# Patient Record
Sex: Male | Born: 1995 | Race: Black or African American | Hispanic: No | Marital: Single | Smoking: Never smoker
Health system: Southern US, Community
[De-identification: ages and names within clinical notes are randomized; demographics above are authoritative.]

## PROBLEM LIST (undated history)

## (undated) HISTORY — PX: MENISCUS REPAIR: SHX5179

---

## 2020-06-21 ENCOUNTER — Emergency Department (HOSPITAL_COMMUNITY)
Admission: EM | Admit: 2020-06-21 | Discharge: 2020-06-21 | Disposition: A | Discharge status: Home / Self Care | Payer: 59 | Attending: Emergency Medicine | Admitting: Emergency Medicine

## 2020-06-21 ENCOUNTER — Encounter (HOSPITAL_COMMUNITY): Payer: Self-pay

## 2020-06-21 ENCOUNTER — Other Ambulatory Visit: Payer: Self-pay

## 2020-06-21 ENCOUNTER — Emergency Department (HOSPITAL_COMMUNITY): Payer: 59

## 2020-06-21 DIAGNOSIS — W108XXA Fall (on) (from) other stairs and steps, initial encounter: Secondary | ICD-10-CM | POA: Insufficient documentation

## 2020-06-21 DIAGNOSIS — H9311 Tinnitus, right ear: Secondary | ICD-10-CM | POA: Insufficient documentation

## 2020-06-21 DIAGNOSIS — Z79899 Other long term (current) drug therapy: Secondary | ICD-10-CM | POA: Insufficient documentation

## 2020-06-21 DIAGNOSIS — S0181XA Laceration without foreign body of other part of head, initial encounter: Secondary | ICD-10-CM | POA: Diagnosis not present

## 2020-06-21 DIAGNOSIS — S0993XA Unspecified injury of face, initial encounter: Secondary | ICD-10-CM | POA: Diagnosis present

## 2020-06-21 DIAGNOSIS — R42 Dizziness and giddiness: Secondary | ICD-10-CM | POA: Insufficient documentation

## 2020-06-21 DIAGNOSIS — Z20822 Contact with and (suspected) exposure to covid-19: Secondary | ICD-10-CM | POA: Insufficient documentation

## 2020-06-21 DIAGNOSIS — S02609A Fracture of mandible, unspecified, initial encounter for closed fracture: Secondary | ICD-10-CM | POA: Diagnosis not present

## 2020-06-21 DIAGNOSIS — Y999 Unspecified external cause status: Secondary | ICD-10-CM | POA: Insufficient documentation

## 2020-06-21 DIAGNOSIS — Y939 Activity, unspecified: Secondary | ICD-10-CM | POA: Insufficient documentation

## 2020-06-21 DIAGNOSIS — Y929 Unspecified place or not applicable: Secondary | ICD-10-CM | POA: Insufficient documentation

## 2020-06-21 LAB — CBC WITH DIFFERENTIAL/PLATELET
Abs Immature Granulocytes: 0.06 10*3/uL (ref 0.00–0.07)
Basophils Absolute: 0.1 10*3/uL (ref 0.0–0.1)
Basophils Relative: 1 %
Eosinophils Absolute: 0.1 10*3/uL (ref 0.0–0.5)
Eosinophils Relative: 0 %
HCT: 44.6 % (ref 39.0–52.0)
Hemoglobin: 14.3 g/dL (ref 13.0–17.0)
Immature Granulocytes: 0 %
Lymphocytes Relative: 9 %
Lymphs Abs: 1.5 10*3/uL (ref 0.7–4.0)
MCH: 27.3 pg (ref 26.0–34.0)
MCHC: 32.1 g/dL (ref 30.0–36.0)
MCV: 85.3 fL (ref 80.0–100.0)
Monocytes Absolute: 1 10*3/uL (ref 0.1–1.0)
Monocytes Relative: 6 %
Neutro Abs: 14.1 10*3/uL — ABNORMAL HIGH (ref 1.7–7.7)
Neutrophils Relative %: 84 %
Platelets: 218 10*3/uL (ref 150–400)
RBC: 5.23 MIL/uL (ref 4.22–5.81)
RDW: 14.5 % (ref 11.5–15.5)
WBC: 16.9 10*3/uL — ABNORMAL HIGH (ref 4.0–10.5)
nRBC: 0 % (ref 0.0–0.2)

## 2020-06-21 LAB — COMPREHENSIVE METABOLIC PANEL
ALT: 20 U/L (ref 0–44)
AST: 22 U/L (ref 15–41)
Albumin: 5 g/dL (ref 3.5–5.0)
Alkaline Phosphatase: 40 U/L (ref 38–126)
Anion gap: 11 (ref 5–15)
BUN: 20 mg/dL (ref 6–20)
CO2: 28 mmol/L (ref 22–32)
Calcium: 9.8 mg/dL (ref 8.9–10.3)
Chloride: 100 mmol/L (ref 98–111)
Creatinine, Ser: 1.38 mg/dL — ABNORMAL HIGH (ref 0.61–1.24)
GFR calc Af Amer: >60 mL/min (ref >60)
GFR calc non Af Amer: >60 mL/min (ref >60)
Glucose, Bld: 155 mg/dL — ABNORMAL HIGH (ref 70–99)
Potassium: 4.3 mmol/L (ref 3.5–5.1)
Sodium: 139 mmol/L (ref 135–145)
Total Bilirubin: 0.5 mg/dL (ref 0.3–1.2)
Total Protein: 7.8 g/dL (ref 6.5–8.1)

## 2020-06-21 MED ORDER — BACITRACIN ZINC 500 UNIT/GM EX OINT
TOPICAL_OINTMENT | Freq: Two times a day (BID) | CUTANEOUS | Status: DC
  Administered 2020-06-21: 1 via TOPICAL
  Filled 2020-06-21: qty 0.9

## 2020-06-21 MED ORDER — LIDOCAINE-EPINEPHRINE 2 %-1:100000 IJ SOLN
20.0000 mL | Freq: Once | INTRAMUSCULAR | Status: AC
  Administered 2020-06-21: 20 mL
  Filled 2020-06-21: qty 1

## 2020-06-21 MED ORDER — SODIUM CHLORIDE 0.9 % IV BOLUS
1000.0000 mL | Freq: Once | INTRAVENOUS | Status: AC
  Administered 2020-06-21: 1000 mL via INTRAVENOUS

## 2020-06-21 MED ORDER — HYDROCODONE-ACETAMINOPHEN 5-325 MG PO TABS: 1.0000 | ORAL_TABLET | Freq: Four times a day (QID) | ORAL | 0 refills | Status: DC | PRN

## 2020-06-21 NOTE — ED Provider Notes (Signed)
[facility]-EMERGENCY DEPT Provider Note   CSN: [id number] Arrival date & time: 06/21/20  1818     History Chief Complaint  Patient presents with  . Laceration    Voris Tigert is a 24 y.o. male.  The history is provided by the patient and medical records. No language interpreter was used.  Laceration  Freedom Peddy is a 24 y.o. male who presents to the Emergency Department complaining of laceration. He presents the emergency department for evaluation following falling down five stairs. He states that he has been suffering from allergies over the last few days. Today he became dizzy and fell down five stairs. He struck his chin. He complains of persistent dizziness as well as pain to his right jaw and ringing in his right ear. He has no known medical problems and takes no medications. He denies any fevers, chest pain, shortness of breath, nausea, vomiting, diarrhea. Last tetanus is unknown. He declines tetanus vaccination in the emergency department.    History reviewed. No pertinent past medical history.  There are no problems to display for this patient.   History reviewed. No pertinent surgical history.     No family history on file.  Social History   Tobacco Use  . Smoking status: Not on file  Substance Use Topics  . Alcohol use: Not on file  . Drug use: Not on file    Home Medications Prior to Admission medications   Medication Sig Start Date End Date Taking? Authorizing Provider  HYDROcodone-acetaminophen (NORCO/VICODIN) 5-325 MG tablet Take 1 tablet by mouth every 6 (six) hours as needed. 06/21/20   Tilden Fossa, MD    Allergies    Patient has no known allergies.  Review of Systems   Review of Systems  All other systems reviewed and are negative.   Physical Exam Updated Vital Signs BP (!) 112/49 (BP Location: Left Arm)   Pulse 75   Temp 98.8 F (37.1 C) (Oral)   Resp 14   Ht 6\' 3"  (1.905 m)   Wt 97.5 kg   SpO2 100%   BMI  26.87 kg/m   Physical Exam Vitals and nursing note reviewed.  Constitutional:      Appearance: He is well-developed.  HENT:     Head: Normocephalic.     Comments: Deep laceration to the chin. Able to fully open and close the mouth. There is mild tenderness to palpation over the right TMJ. No hemotympanum. Cardiovascular:     Rate and Rhythm: Normal rate and regular rhythm.     Heart sounds: No murmur heard.   Pulmonary:     Effort: Pulmonary effort is normal. No respiratory distress.     Breath sounds: Normal breath sounds.  Abdominal:     Palpations: Abdomen is soft.     Tenderness: There is no abdominal tenderness. There is no guarding or rebound.  Musculoskeletal:        General: No tenderness.     Cervical back: Neck supple.  Skin:    General: Skin is warm and dry.  Neurological:     Mental Status: He is alert and oriented to person, place, and time.     Comments: 5/5 strength in all four extremities  Psychiatric:        Behavior: Behavior normal.     ED Results / Procedures / Treatments   Labs (all labs ordered are listed, but only abnormal results are displayed) Labs Reviewed  COMPREHENSIVE METABOLIC PANEL - Abnormal; Notable for the following  components:      Result Value   Glucose, Bld 155 (*)    Creatinine, Ser 1.38 (*)    All other components within normal limits  CBC WITH DIFFERENTIAL/PLATELET - Abnormal; Notable for the following components:   WBC 16.9 (*)    Neutro Abs 14.1 (*)    All other components within normal limits  SARS CORONAVIRUS 2 BY RT PCR (HOSPITAL ORDER, PERFORMED IN [facility])    EKG None  Radiology CT Head Wo Contrast  Result Date: 06/21/2020 CLINICAL DATA:  Facial trauma, fall with chin laceration EXAM: CT HEAD WITHOUT CONTRAST CT MAXILLOFACIAL WITHOUT CONTRAST TECHNIQUE: Multidetector CT imaging of the head and maxillofacial structures were performed using the standard protocol without intravenous contrast.  Multiplanar CT image reconstructions of the maxillofacial structures were also generated. COMPARISON:  None. FINDINGS: CT HEAD FINDINGS Brain: No acute territorial infarction, hemorrhage or intracranial mass. The ventricles are nonenlarged. Vascular: No hyperdense vessel or unexpected calcification. Skull: Normal. Negative for fracture or focal lesion. Other: None CT MAXILLOFACIAL FINDINGS Osseous: Mandibular heads are normally position. The mastoid air cells are clear. Acute mildly comminuted, nondisplaced fractures involving the bilateral mandibular heads and necks. Pterygoid plates and zygomatic arches are intact. Minimal anterior nasal bone deformity. Orbits: Negative. No traumatic or inflammatory finding. Sinuses: Clear. Soft tissues: Deep laceration at the chin, approaches the bony mental protuberance. IMPRESSION: 1. No CT evidence for acute intracranial abnormality. 2. Acute mildly comminuted, nondisplaced bilateral mandibular head and neck fractures. No mandibular head dislocation. 3. Deep laceration at the chin. Electronically Signed   By: Jasmine Pang M.D.   On: 06/21/2020 21:57   CT Maxillofacial WO CM  Result Date: 06/21/2020 CLINICAL DATA:  Facial trauma, fall with chin laceration EXAM: CT HEAD WITHOUT CONTRAST CT MAXILLOFACIAL WITHOUT CONTRAST TECHNIQUE: Multidetector CT imaging of the head and maxillofacial structures were performed using the standard protocol without intravenous contrast. Multiplanar CT image reconstructions of the maxillofacial structures were also generated. COMPARISON:  None. FINDINGS: CT HEAD FINDINGS Brain: No acute territorial infarction, hemorrhage or intracranial mass. The ventricles are nonenlarged. Vascular: No hyperdense vessel or unexpected calcification. Skull: Normal. Negative for fracture or focal lesion. Other: None CT MAXILLOFACIAL FINDINGS Osseous: Mandibular heads are normally position. The mastoid air cells are clear. Acute mildly comminuted, nondisplaced  fractures involving the bilateral mandibular heads and necks. Pterygoid plates and zygomatic arches are intact. Minimal anterior nasal bone deformity. Orbits: Negative. No traumatic or inflammatory finding. Sinuses: Clear. Soft tissues: Deep laceration at the chin, approaches the bony mental protuberance. IMPRESSION: 1. No CT evidence for acute intracranial abnormality. 2. Acute mildly comminuted, nondisplaced bilateral mandibular head and neck fractures. No mandibular head dislocation. 3. Deep laceration at the chin. Electronically Signed   By: Jasmine Pang M.D.   On: 06/21/2020 21:57    Procedures .Marland KitchenLaceration Repair  Date/Time: 06/21/2020 11:12 PM Performed by: Tilden Fossa, MD Authorized by: Tilden Fossa, MD   Consent:    Consent obtained:  Verbal   Consent given by:  Patient   Risks discussed:  Infection, pain, poor cosmetic result and need for additional repair Anesthesia (see MAR for exact dosages):    Anesthesia method:  Local infiltration   Local anesthetic:  Lidocaine 2% WITH epi Laceration details:    Location:  Face   Face location:  Chin   Length (cm):  1 Repair type:    Repair type:  Complex Exploration:    Wound exploration: wound explored through full range of motion  Wound extent: foreign bodies/material     Foreign bodies/material:  Hair   Contaminated: yes   Treatment:    Area cleansed with:  Shur-Clens and saline   Irrigation method:  Syringe   Visualized foreign bodies/material removed: yes     Debridement:  Moderate Subcutaneous repair:    Suture size:  5-0   Suture material:  Vicryl   Suture technique:  Simple interrupted   Number of sutures:  2 Skin repair:    Repair method:  Sutures   Suture size:  4-0   Suture material:  Prolene   Suture technique:  Simple interrupted   Number of sutures:  5 Approximation:    Approximation:  Close Post-procedure details:    Dressing:  Antibiotic ointment and non-adherent dressing   Patient tolerance  of procedure:  Tolerated well, no immediate complications   (including critical care time)  Medications Ordered in ED Medications  bacitracin ointment (has no administration in time range)  sodium chloride 0.9 % bolus 1,000 mL (0 mLs Intravenous Stopped 06/21/20 2219)  lidocaine-EPINEPHrine (XYLOCAINE W/EPI) 2 %-1:100000 (with pres) injection 20 mL (20 mLs Infiltration Given by Other 06/21/20 2217)    ED Course  I have reviewed the triage vital signs and the nursing notes.  Pertinent labs & imaging results that were available during my care of the patient were reviewed by me and considered in my medical decision making (see chart for details).    MDM Rules/Calculators/A&P                         Patient here for evaluation of injury after having a dizzy spell and falling down five stairs. He is a deep laceration to his chin that was repaired per procedure note. There is a large amount of hair removed from the wound. Imaging is significant for by mandibular head and neck fractures. Discussed findings of imaging with Dr. Kelly Splinter Dillingham with plastic surgery who recommends liquid diet and follow-up in the office. There is no evidence of open fracture on examination. Discussed with patient home care for mandible fracture as well as chin laceration.  Final Clinical Impression(s) / ED Diagnoses Final diagnoses:  Chin laceration, initial encounter  Closed fracture of mandible, unspecified laterality, unspecified mandibular site, initial encounter [facility])    Rx / DC Orders ED Discharge Orders         Ordered    HYDROcodone-acetaminophen (NORCO/VICODIN) 5-325 MG tablet  Every 6 hours PRN        06/21/20 2305           Tilden Fossa, MD 06/21/20 2316

## 2020-06-21 NOTE — Discharge Instructions (Signed)
You have a broken jaw (mandible) and should not eat solid food. Only have a liquid diet until you follow-up with the surgeon. Wash your laceration every day. Get rechecked immediately if you develop drainage, fevers, increased pain or new concerning symptoms.

## 2020-06-21 NOTE — ED Triage Notes (Signed)
Patient arrived stating he fell on concrete at 5pm today causing a chin laceration. Bleeding controlled. Denies LOC

## 2020-06-21 NOTE — ED Notes (Signed)
Pt transported to CT ?

## 2020-06-22 LAB — SARS CORONAVIRUS 2 BY RT PCR (HOSPITAL ORDER, PERFORMED IN ~~LOC~~ HOSPITAL LAB): SARS Coronavirus 2: NEGATIVE

## 2021-03-25 IMAGING — CT CT HEAD W/O CM
3 series · 15 of 47 positions shown, 18 images · non-contrast
Comparison: None.

CLINICAL DATA: Facial trauma, fall with chin laceration

EXAM:
CT HEAD WITHOUT CONTRAST
CT MAXILLOFACIAL WITHOUT CONTRAST
TECHNIQUE: Multidetector CT imaging of the head and maxillofacial structures
were performed using the standard protocol without intravenous
contrast. Multiplanar CT image reconstructions of the maxillofacial
structures were also generated.

[Series 3: head wo · axial · 0.45mm/px · z∈[-162,-27]mm · 9 of 33 slices shown, 12 images]
[im 3/33  brain]
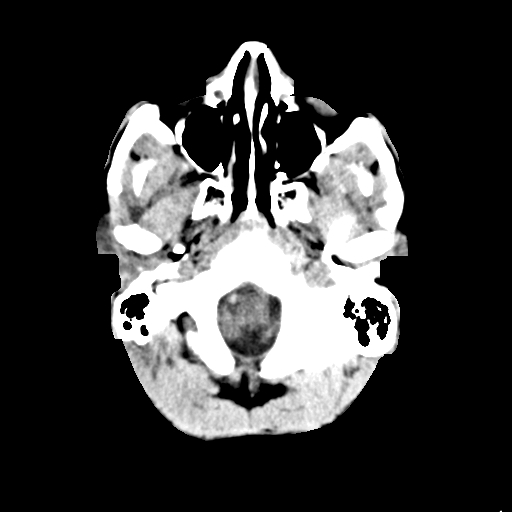
[im 3/33  bone]
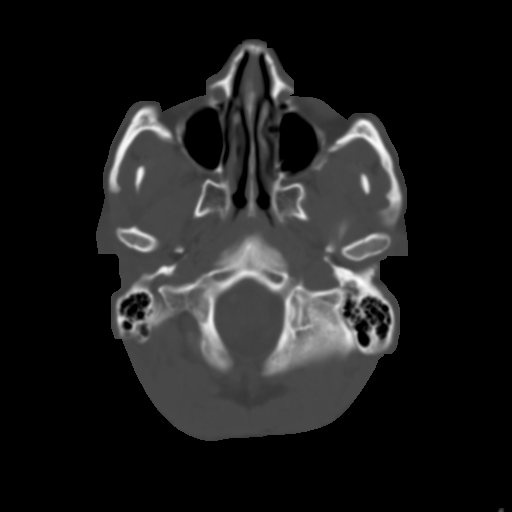
[im 6/33  brain]
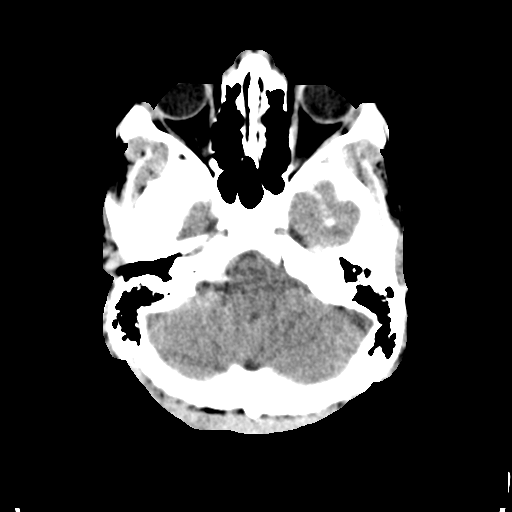
[im 9/33  brain]
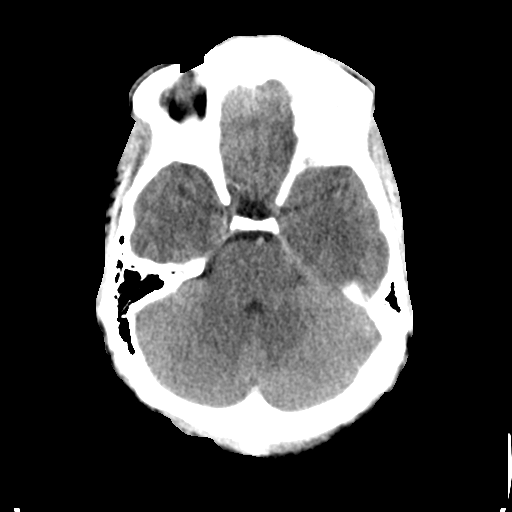
[im 13/33  brain]
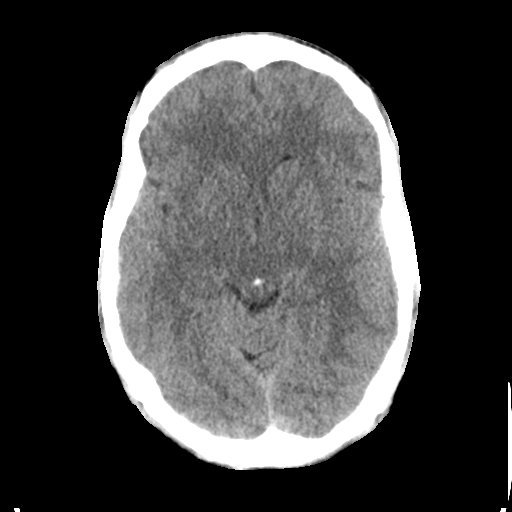
[im 17/33  brain]
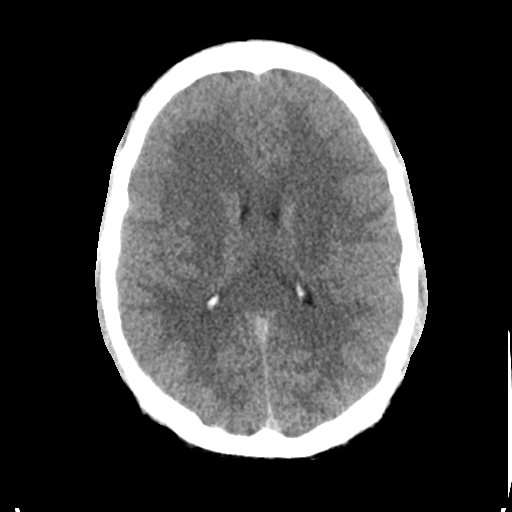
[im 17/33  bone]
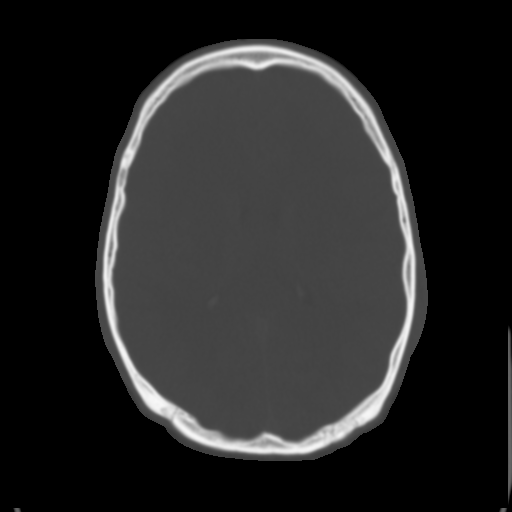
[im 20/33  brain]
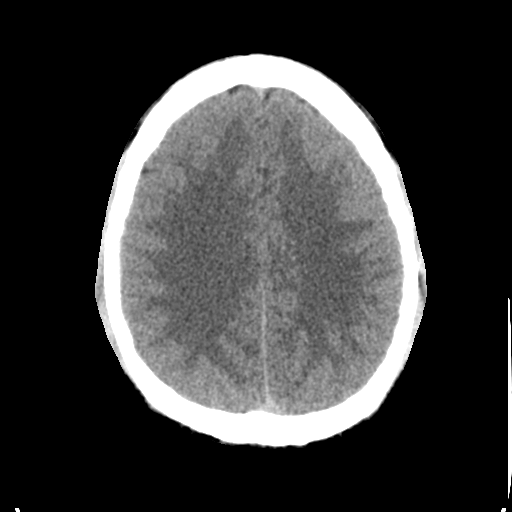
[im 24/33  brain]
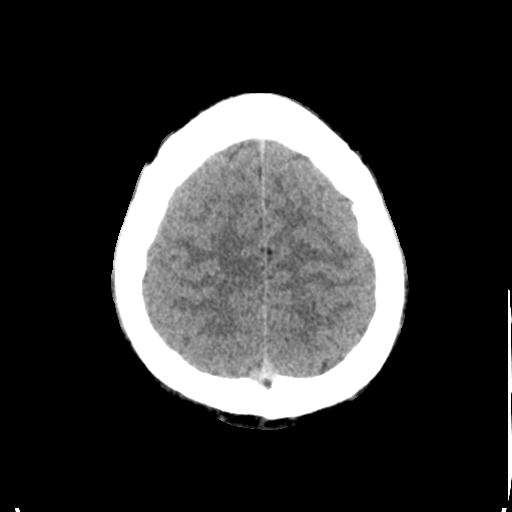
[im 27/33  brain]
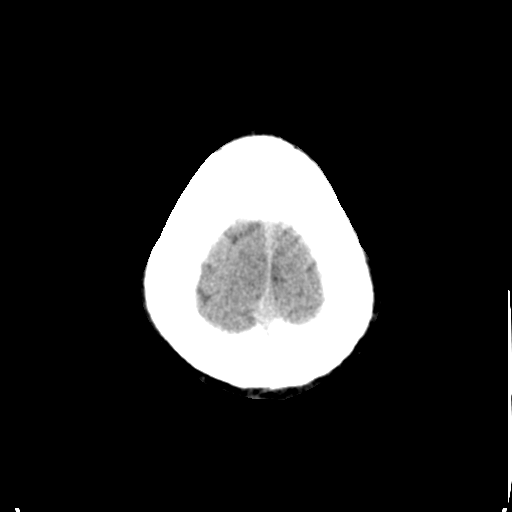
[im 30/33  brain]
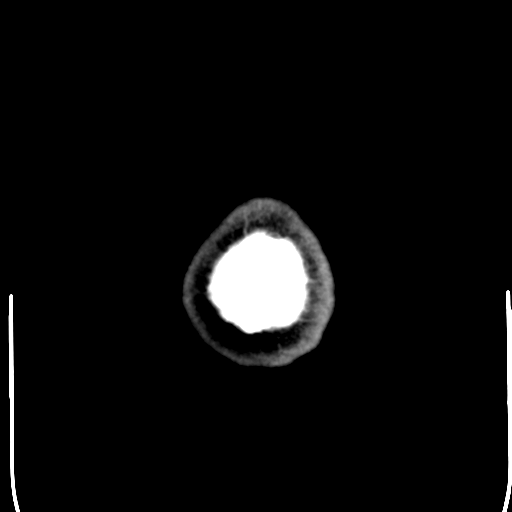
[im 30/33  bone]
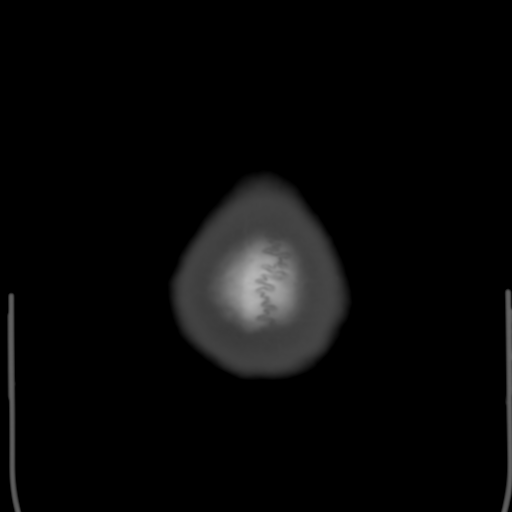

[Series 10: coronal soft tissue · coronal · 0.33mm/px · 3 of 75 slices shown]
[im 25/75  brain]
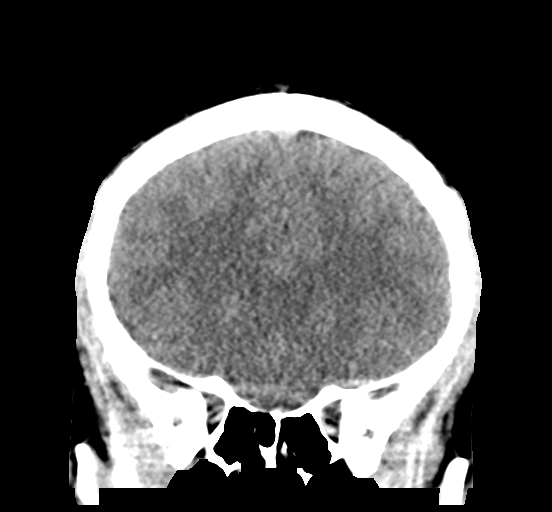
[im 33/75  brain]
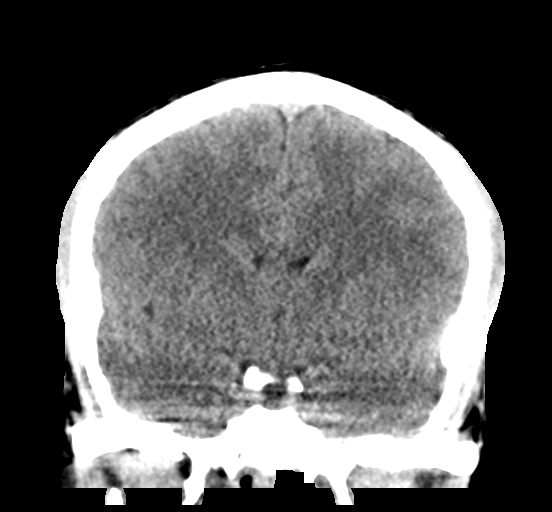
[im 42/75  brain]
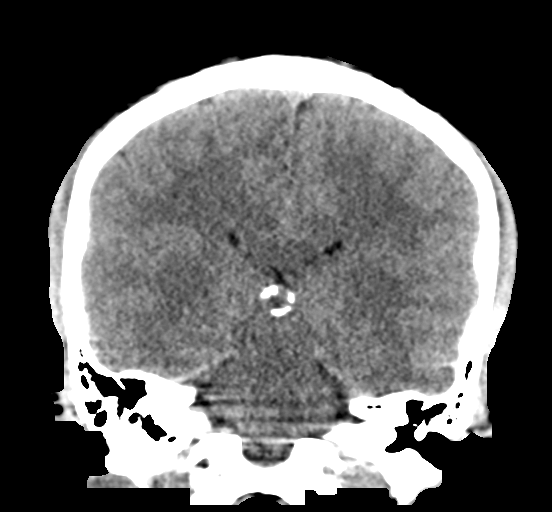

[Series 11: sagittal soft tissue · sagittal · 0.34mm/px · 3 of 63 slices shown]
[im 21/63  brain]
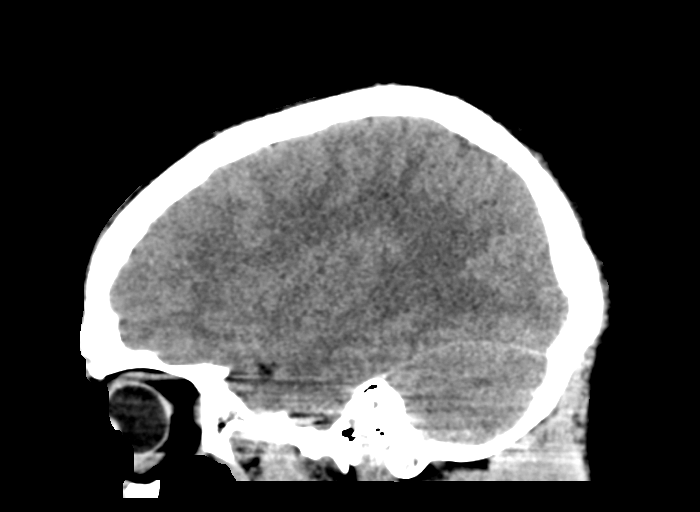
[im 32/63  brain]
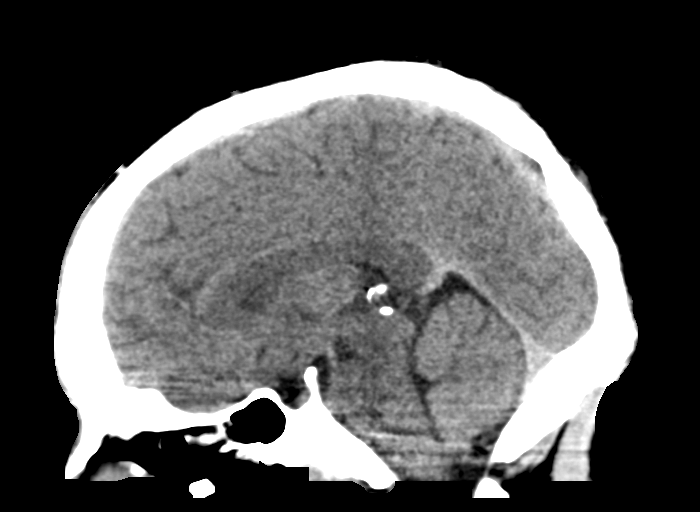
[im 42/63  brain]
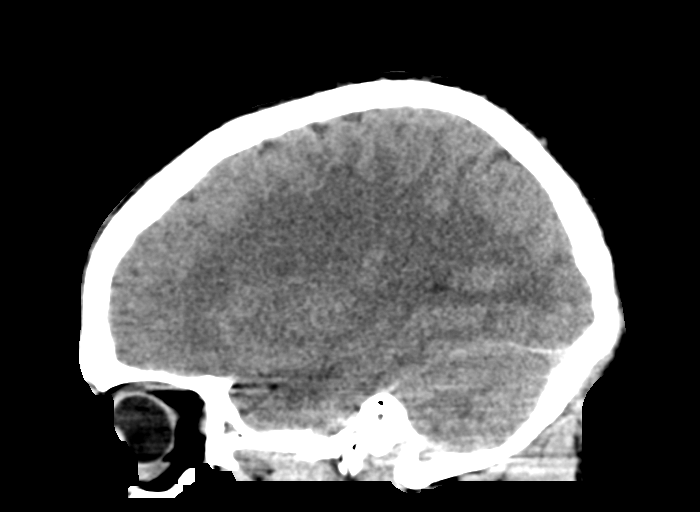

[15 of 47 positions shown; findings below may reference images not displayed]

FINDINGS: CT HEAD FINDINGS

Brain: No acute territorial infarction, hemorrhage or intracranial
mass. The ventricles are nonenlarged.

Vascular: No hyperdense vessel or unexpected calcification.

Skull: Normal. Negative for fracture or focal lesion.

Other: None

CT MAXILLOFACIAL FINDINGS

Osseous: Mandibular heads are normally position. The mastoid air
cells are clear. Acute mildly comminuted, nondisplaced fractures
involving the bilateral mandibular heads and necks. Pterygoid plates
and zygomatic arches are intact. Minimal anterior nasal bone
deformity.

Orbits: Negative. No traumatic or inflammatory finding.

Sinuses: Clear.

Soft tissues: Deep laceration at the chin, approaches the bony
mental protuberance.
IMPRESSION: 1. No CT evidence for acute intracranial abnormality.
2. Acute mildly comminuted, nondisplaced bilateral mandibular head
and neck fractures. No mandibular head dislocation.
3. Deep laceration at the chin.

## 2021-03-25 IMAGING — CT CT MAXILLOFACIAL W/O CM
3 series · 15 of 47 positions shown, 18 images · non-contrast
Comparison: None.

CLINICAL DATA: Facial trauma, fall with chin laceration

EXAM:
CT HEAD WITHOUT CONTRAST
CT MAXILLOFACIAL WITHOUT CONTRAST
TECHNIQUE: Multidetector CT imaging of the head and maxillofacial structures
were performed using the standard protocol without intravenous
contrast. Multiplanar CT image reconstructions of the maxillofacial
structures were also generated.

[Series 6: max soft · axial · 0.35mm/px · z∈[-271,-121]mm · 9 of 89 slices shown, 12 images]
[im 7/89  brain]
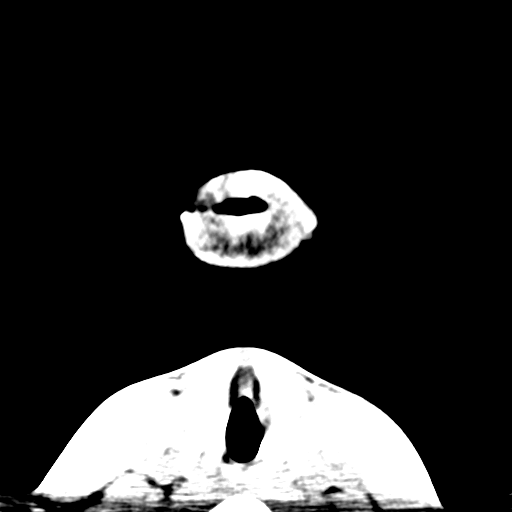
[im 7/89  bone]
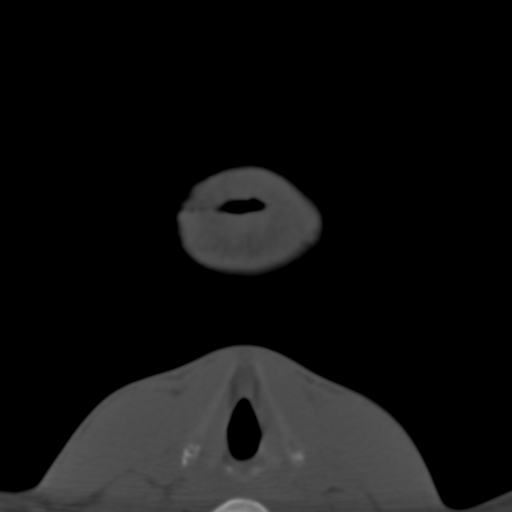
[im 16/89  bone]
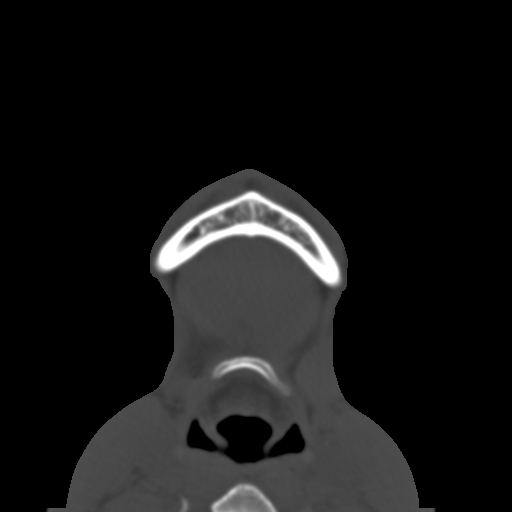
[im 25/89  bone]
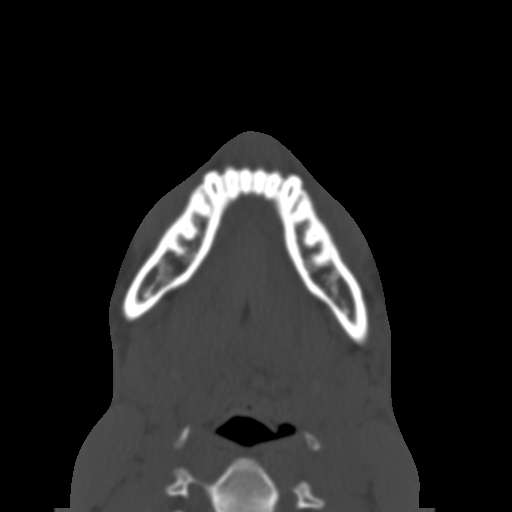
[im 34/89  bone]
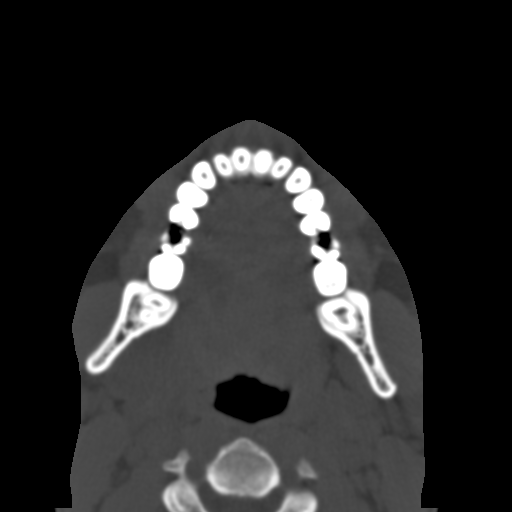
[im 46/89  brain]
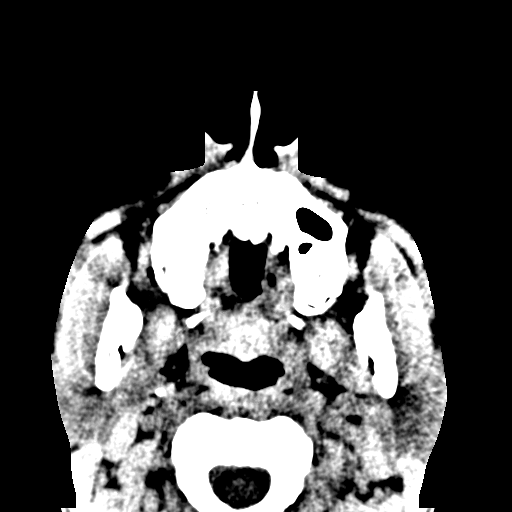
[im 46/89  bone]
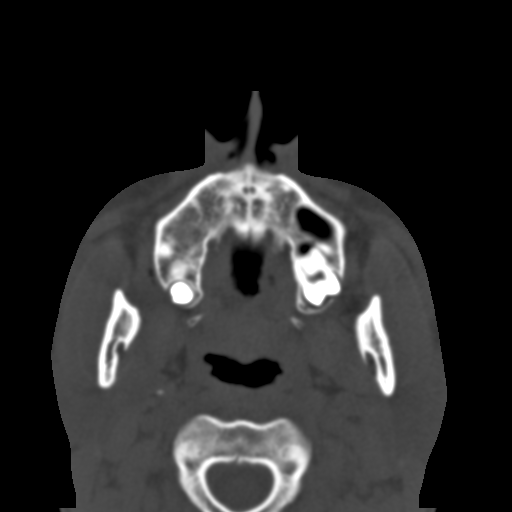
[im 55/89  bone]
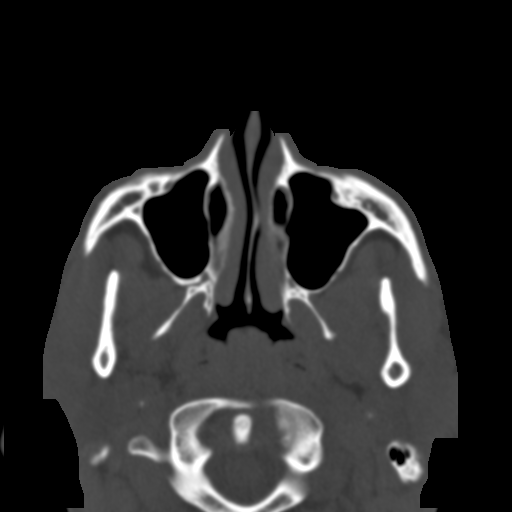
[im 64/89  bone]
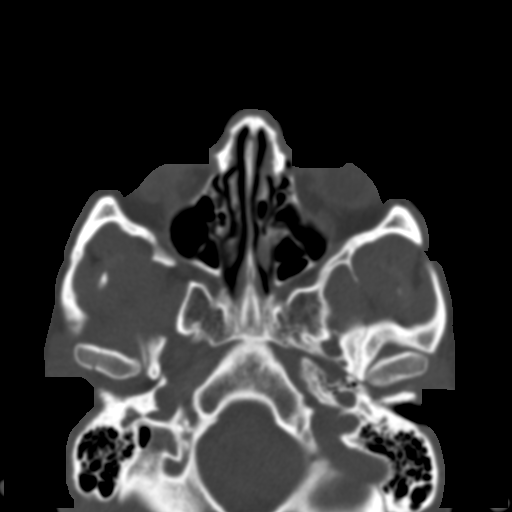
[im 73/89  bone]
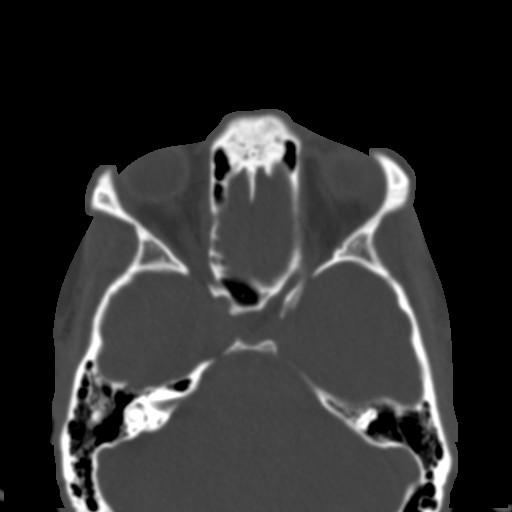
[im 82/89  brain]
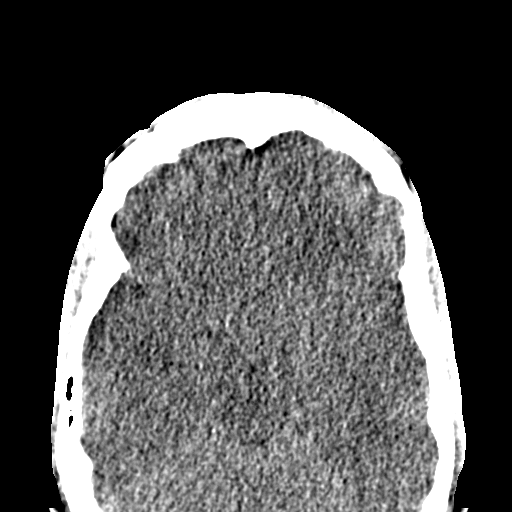
[im 82/89  bone]
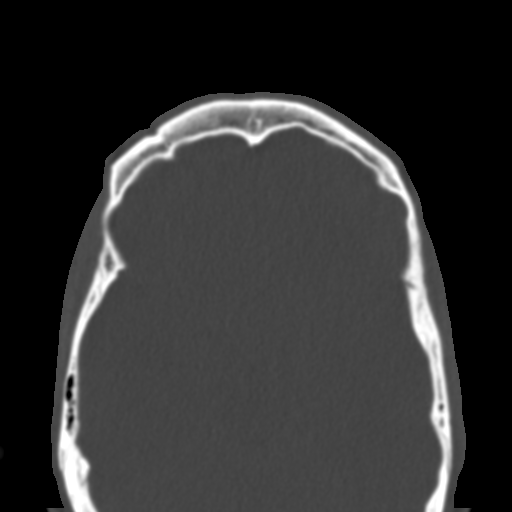

[Series 12: coronal soft · coronal · 0.36mm/px · 3 of 82 slices shown]
[im 28/82  bone]
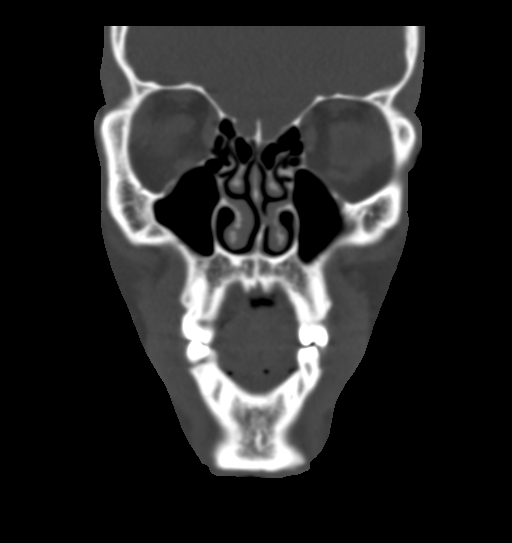
[im 37/82  bone]
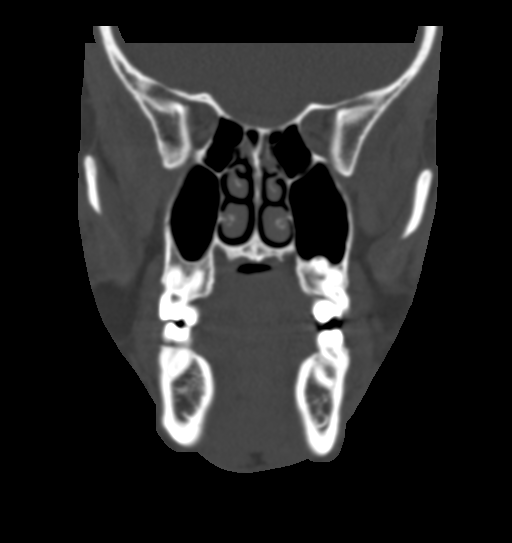
[im 46/82  bone]
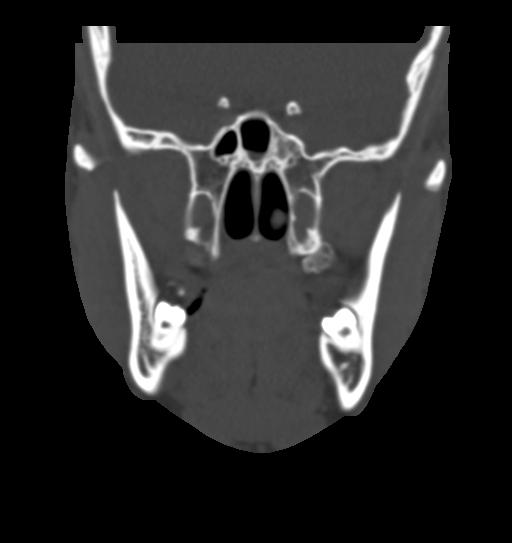

[Series 13: sagittal soft · sagittal · 0.31mm/px · 3 of 92 slices shown]
[im 31/92  bone]
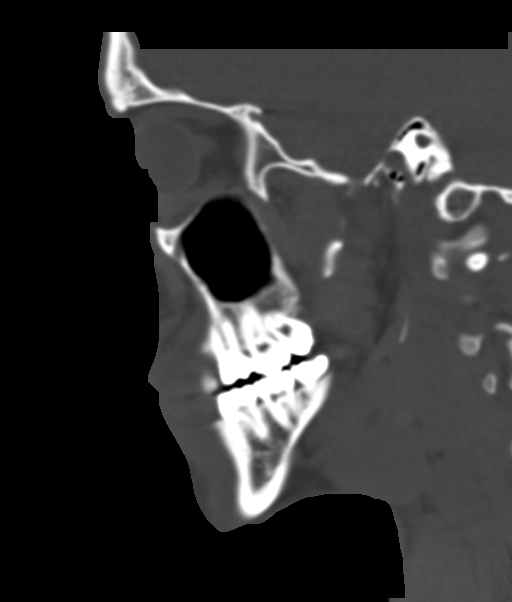
[im 46/92  bone]
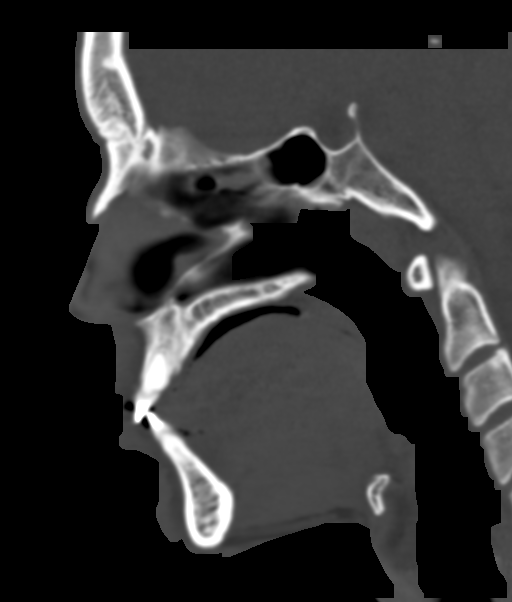
[im 61/92  bone]
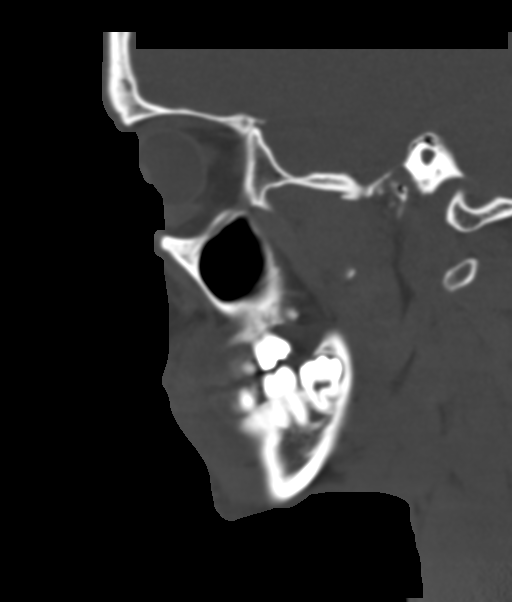

[15 of 47 positions shown; findings below may reference images not displayed]

FINDINGS: CT HEAD FINDINGS

Brain: No acute territorial infarction, hemorrhage or intracranial
mass. The ventricles are nonenlarged.

Vascular: No hyperdense vessel or unexpected calcification.

Skull: Normal. Negative for fracture or focal lesion.

Other: None

CT MAXILLOFACIAL FINDINGS

Osseous: Mandibular heads are normally position. The mastoid air
cells are clear. Acute mildly comminuted, nondisplaced fractures
involving the bilateral mandibular heads and necks. Pterygoid plates
and zygomatic arches are intact. Minimal anterior nasal bone
deformity.

Orbits: Negative. No traumatic or inflammatory finding.

Sinuses: Clear.

Soft tissues: Deep laceration at the chin, approaches the bony
mental protuberance.
IMPRESSION: 1. No CT evidence for acute intracranial abnormality.
2. Acute mildly comminuted, nondisplaced bilateral mandibular head
and neck fractures. No mandibular head dislocation.
3. Deep laceration at the chin.

## 2022-03-28 ENCOUNTER — Ambulatory Visit: Payer: Self-pay | Admitting: Family Medicine

## 2022-03-30 ENCOUNTER — Ambulatory Visit (INDEPENDENT_AMBULATORY_CARE_PROVIDER_SITE_OTHER): Payer: 59 | Admitting: Family Medicine

## 2022-03-30 ENCOUNTER — Encounter: Payer: Self-pay | Admitting: Family Medicine

## 2022-03-30 VITALS — BP 114/68 | HR 69 | Ht 75.0 in | Wt 217.0 lb

## 2022-03-30 DIAGNOSIS — Z0000 Encounter for general adult medical examination without abnormal findings: Secondary | ICD-10-CM

## 2022-03-30 DIAGNOSIS — R69 Illness, unspecified: Secondary | ICD-10-CM | POA: Diagnosis not present

## 2022-03-30 DIAGNOSIS — Z13220 Encounter for screening for lipoid disorders: Secondary | ICD-10-CM

## 2022-03-30 DIAGNOSIS — Z113 Encounter for screening for infections with a predominantly sexual mode of transmission: Secondary | ICD-10-CM | POA: Diagnosis not present

## 2022-03-30 NOTE — Progress Notes (Signed)
Isaac Keller - 27 y.o. male MRN [id number]  Date of birth: 07-27-1996  Subjective Chief Complaint  Patient presents with   Establish Care    HPI Isaac Keller is a 26 year old male here today for initial visit to establish care and for annual exam.  He would like to have updated labs including STI screening today.  He does report that he had a partner with chlamydia however he denies any symptoms.  He does use condoms for protection.  He does stay pretty active.  Works as a Corporate treasurer. He feels that his diet is pretty good.  He is a non-smoker.  Consumes alcohol occasionally.  Review of Systems  Constitutional:  Negative for chills, fever, malaise/fatigue and weight loss.  HENT:  Negative for congestion, ear pain and sore throat.   Eyes:  Negative for blurred vision, double vision and pain.  Respiratory:  Negative for cough and shortness of breath.   Cardiovascular:  Negative for chest pain and palpitations.  Gastrointestinal:  Negative for abdominal pain, blood in stool, constipation, heartburn and nausea.  Genitourinary:  Negative for dysuria and urgency.  Musculoskeletal:  Negative for joint pain and myalgias.  Neurological:  Negative for dizziness and headaches.  Endo/Heme/Allergies:  Does not bruise/bleed easily.  Psychiatric/Behavioral:  Negative for depression. The patient is not nervous/anxious and does not have insomnia.     No Known Allergies  History reviewed. No pertinent past medical history.  Past Surgical History:  Procedure Laterality Date   MENISCUS REPAIR      Social History   Socioeconomic History   Marital status: Single    Spouse name: Not on file   Number of children: Not on file   Years of education: Not on file   Highest education level: Not on file  Occupational History   Not on file  Tobacco Use   Smoking status: Never   Smokeless tobacco: Never  Vaping Use   Vaping Use: Never used  Substance and Sexual Activity   Alcohol use: Yes     Alcohol/week: 1.0 - 2.0 standard drink of alcohol    Types: 1 - 2 Standard drinks or equivalent per week   Drug use: Never   Sexual activity: Yes    Partners: Female    Birth control/protection: Condom  Other Topics Concern   Not on file  Social History Narrative   Not on file   Social Determinants of Health   Financial Resource Strain: Not on file  Food Insecurity: Not on file  Transportation Needs: Not on file  Physical Activity: Not on file  Stress: Not on file  Social Connections: Not on file    History reviewed. No pertinent family history.  Health Maintenance  Topic Date Due   HIV Screening  Never done   COVID-19 Vaccine (1) 04/15/2022 (Originally 08/08/1996)   Hepatitis C Screening  03/31/2023 (Originally 02/07/2016)   INFLUENZA VACCINE  05/10/2022   TETANUS/TDAP  06/23/2031   HPV VACCINES  Completed     ----------------------------------------------------------------------------------------------------------------------------------------------------------------------------------------------------------------- Physical Exam BP 114/68 (BP Location: Left Arm, Patient Position: Sitting, Cuff Size: Normal)   Pulse 69   Ht 6\' 3"  (1.905 m)   Wt 217 lb (98.4 kg)   SpO2 99%   BMI 27.12 kg/m   Physical Exam Constitutional:      General: He is not in acute distress. HENT:     Head: Normocephalic and atraumatic.     Right Ear: Tympanic membrane and external ear normal.     Left Ear: Tympanic  membrane and external ear normal.  Eyes:     General: No scleral icterus. Neck:     Thyroid: No thyromegaly.  Cardiovascular:     Rate and Rhythm: Normal rate and regular rhythm.     Heart sounds: Normal heart sounds.  Pulmonary:     Effort: Pulmonary effort is normal.     Breath sounds: Normal breath sounds.  Abdominal:     General: Bowel sounds are normal. There is no distension.     Palpations: Abdomen is soft.     Tenderness: There is no abdominal tenderness. There  is no guarding.  Musculoskeletal:     Cervical back: Normal range of motion.  Lymphadenopathy:     Cervical: No cervical adenopathy.  Skin:    General: Skin is warm and dry.     Findings: No rash.  Neurological:     Mental Status: He is alert and oriented to person, place, and time.     Cranial Nerves: No cranial nerve deficit.     Motor: No abnormal muscle tone.  Psychiatric:        Mood and Affect: Mood normal.        Behavior: Behavior normal.     ------------------------------------------------------------------------------------------------------------------------------------------------------------------------------------------------------------------- Assessment and Plan  Well adult exam Well adult Orders Placed This Encounter  Procedures   Chlamydia/Neisseria Gonorrhoeae RNA,TMA,Urogenital   Trichomonas vaginalis, RNA   COMPLETE METABOLIC PANEL WITH GFR   CBC with Differential   Lipid Panel w/reflex Direct LDL   HIV antibody (with reflex)   Hepatitis C Antibody   RPR  Screenings: Per lab orders Immunizations: Up-to-date Anticipatory guidance/risk factor reduction: Recommendations per AVS.     No orders of the defined types were placed in this encounter.   No follow-ups on file.    This visit occurred during the SARS-CoV-2 public health emergency.  Safety protocols were in place, including screening questions prior to the visit, additional usage of staff PPE, and extensive cleaning of exam room while observing appropriate contact time as indicated for disinfecting solutions.

## 2022-03-30 NOTE — Patient Instructions (Signed)
Preventive Care 18-24 Years Old, Male ?Preventive care refers to lifestyle choices and visits with your health care provider that can promote health and wellness. At this stage in your life, you may start seeing a primary care physician instead of a pediatrician for your preventive care. Preventive care visits are also called wellness exams. ?What can I expect for my preventive care visit? ?Counseling ?During your preventive care visit, your health care provider may ask about your: ?Medical history, including: ?Past medical problems. ?Family medical history. ?Current health, including: ?Home life and relationship well-being. ?Emotional well-being. ?Sexual activity and sexual health. ?Lifestyle, including: ?Alcohol, nicotine or tobacco, and drug use. ?Access to firearms. ?Diet, exercise, and sleep habits. ?Sunscreen use. ?Motor vehicle safety. ?Physical exam ?Your health care provider may check your: ?Height and weight. These may be used to calculate your BMI (body mass index). BMI is a measurement that tells if you are at a healthy weight. ?Waist circumference. This measures the distance around your waistline. This measurement also tells if you are at a healthy weight and may help predict your risk of certain diseases, such as type 2 diabetes and high blood pressure. ?Heart rate and blood pressure. ?Body temperature. ?Skin for abnormal spots. ?What immunizations do I need? ? ?Vaccines are usually given at various ages, according to a schedule. Your health care provider will recommend vaccines for you based on your age, medical history, and lifestyle or other factors, such as travel or where you work. ?What tests do I need? ?Screening ?Your health care provider may recommend screening tests for certain conditions. This may include: ?Vision and hearing tests. ?Lipid and cholesterol levels. ?Hepatitis B test. ?Hepatitis C test. ?HIV (human immunodeficiency virus) test. ?STI (sexually transmitted infection) testing, if  you are at risk. ?Tuberculosis skin test. ?Talk with your health care provider about your test results, treatment options, and if necessary, the need for more tests. ?Follow these instructions at home: ?Eating and drinking ? ?Eat a healthy diet that includes fresh fruits and vegetables, whole grains, lean protein, and low-fat dairy products. ?Drink enough fluid to keep your urine pale yellow. ?Do not drink alcohol if: ?Your health care provider tells you not to drink. ?You are under the legal drinking age. In the [place] the legal drinking age is 21. ?If you drink alcohol: ?Limit how much you have to 0-2 drinks a day. ?Know how much alcohol is in your drink. In the [place] one drink equals one 12 oz bottle of beer (355 mL), one 5 oz glass of wine (148 mL), or one 1? oz glass of hard liquor (44 mL). ?Lifestyle ?Brush your teeth every morning and night with fluoride toothpaste. Floss one time each day. ?Exercise for at least 30 minutes 5 or more days of the week. ?Do not use any products that contain nicotine or tobacco. These products include cigarettes, chewing tobacco, and vaping devices, such as e-cigarettes. If you need help quitting, ask your health care provider. ?Do not use drugs. ?If you are sexually active, practice safe sex. Use a condom or other form of protection to prevent STIs. ?Find healthy ways to manage stress, such as: ?Meditation, yoga, or listening to music. ?Journaling. ?Talking to a trusted person. ?Spending time with friends and family. ?Safety ?Always wear your seat belt while driving or riding in a vehicle. ?Do not drive: ?If you have been drinking alcohol. Do not ride with someone who has been drinking. ?When you are tired or distracted. ?While texting. ?If you have been using   any mind-altering substances or drugs. ?Wear a helmet and other protective equipment during sports activities. ?If you have firearms in your house, make sure you follow all gun safety procedures. ?Seek help if you have  been bullied, physically abused, or sexually abused. ?Use the internet responsibly to avoid dangers, such as online bullying and online sex predators. ?What's next? ?Go to your health care provider once a year for an annual wellness visit. ?Ask your health care provider how often you should have your eyes and teeth checked. ?Stay up to date on all vaccines. ?This information is not intended to replace advice given to you by your health care provider. Make sure you discuss any questions you have with your health care provider. ?Document Revised: 03/24/2021 Document Reviewed: 03/24/2021 ?Elsevier Patient Education ? 2023 Elsevier Inc[employer] ? ?

## 2022-03-30 NOTE — Assessment & Plan Note (Signed)
Well adult Orders Placed This Encounter  Procedures  . Chlamydia/Neisseria Gonorrhoeae RNA,TMA,Urogenital  . Trichomonas vaginalis, RNA  . COMPLETE METABOLIC PANEL WITH GFR  . CBC with Differential  . Lipid Panel w/reflex Direct LDL  . HIV antibody (with reflex)  . Hepatitis C Antibody  . RPR  Screenings: Per lab orders Immunizations: Up-to-date Anticipatory guidance/risk factor reduction: Recommendations per AVS.

## 2022-04-03 ENCOUNTER — Telehealth: Payer: 59 | Admitting: Family

## 2022-04-03 NOTE — Progress Notes (Signed)
Patient has form he needs his PCP to complete. Told he would need to take paperwork to their office. He will drop paperwork tomorrow.   Jannifer Rodney, FNP

## 2022-04-04 ENCOUNTER — Telehealth: Payer: Self-pay | Admitting: Family Medicine

## 2022-04-04 ENCOUNTER — Encounter: Payer: Self-pay | Admitting: Family Medicine

## 2022-04-04 ENCOUNTER — Other Ambulatory Visit: Payer: Self-pay | Admitting: Family Medicine

## 2022-04-04 MED ORDER — DOXYCYCLINE HYCLATE 100 MG PO TABS: 100.0000 mg | ORAL_TABLET | Freq: Two times a day (BID) | ORAL | 0 refills | Status: DC

## 2022-04-04 NOTE — Telephone Encounter (Signed)
Pt states he had his physical last week so he came in to leave the form for Dr. Ashley Royalty to fill out . I have placed in Dr Ashley Royalty basket.

## 2022-04-04 NOTE — Telephone Encounter (Signed)
Patient has been scheduled. AMUCK ?

## 2022-04-05 ENCOUNTER — Other Ambulatory Visit: Payer: Self-pay

## 2022-04-05 MED ORDER — DOXYCYCLINE HYCLATE 100 MG PO TABS: 100.0000 mg | ORAL_TABLET | Freq: Two times a day (BID) | ORAL | 0 refills | Status: AC

## 2022-04-06 ENCOUNTER — Ambulatory Visit: Payer: 59

## 2022-04-06 LAB — HEPATITIS C ANTIBODY: Hepatitis C Ab: NONREACTIVE

## 2022-04-06 LAB — CBC WITH DIFFERENTIAL/PLATELET
Absolute Monocytes: 570 cells/uL (ref 200–950)
Basophils Absolute: 69 cells/uL (ref 0–200)
Basophils Relative: 0.9 %
Eosinophils Absolute: 193 cells/uL (ref 15–500)
Eosinophils Relative: 2.5 %
HCT: 40.3 % (ref 38.5–50.0)
Hemoglobin: 13.3 g/dL (ref 13.2–17.1)
Lymphs Abs: 2572 cells/uL (ref 850–3900)
MCH: 27.7 pg (ref 27.0–33.0)
MCHC: 33 g/dL (ref 32.0–36.0)
MCV: 83.8 fL (ref 80.0–100.0)
MPV: 11.9 fL (ref 7.5–12.5)
Monocytes Relative: 7.4 %
Neutro Abs: 4297 cells/uL (ref 1500–7800)
Neutrophils Relative %: 55.8 %
Platelets: 224 10*3/uL (ref 140–400)
RBC: 4.81 10*6/uL (ref 4.20–5.80)
RDW: 13.2 % (ref 11.0–15.0)
Total Lymphocyte: 33.4 %
WBC: 7.7 10*3/uL (ref 3.8–10.8)

## 2022-04-06 LAB — CHLAMYDIA/NEISSERIA GONORRHOEAE RNA,TMA,UROGENTIAL
C. trachomatis RNA, TMA: DETECTED — AB
N. gonorrhoeae RNA, TMA: NOT DETECTED

## 2022-04-06 LAB — LIPID PANEL W/REFLEX DIRECT LDL
Cholesterol: 105 mg/dL (ref <200)
HDL: 46 mg/dL (ref >=40)
LDL Cholesterol (Calc): 46 mg/dL (calc)
Non-HDL Cholesterol (Calc): 59 mg/dL (calc) (ref <130)
Total CHOL/HDL Ratio: 2.3 (calc) (ref <5.0)
Triglycerides: 57 mg/dL (ref <150)

## 2022-04-06 LAB — COMPLETE METABOLIC PANEL WITH GFR
AG Ratio: 2.3 (calc) (ref 1.0–2.5)
ALT: 18 U/L (ref 9–46)
AST: 24 U/L (ref 10–40)
Albumin: 4.5 g/dL (ref 3.6–5.1)
Alkaline phosphatase (APISO): 38 U/L (ref 36–130)
BUN: 16 mg/dL (ref 7–25)
CO2: 28 mmol/L (ref 20–32)
Calcium: 9.4 mg/dL (ref 8.6–10.3)
Chloride: 106 mmol/L (ref 98–110)
Creat: 1.24 mg/dL (ref 0.60–1.24)
Globulin: 2 g/dL (calc) (ref 1.9–3.7)
Glucose, Bld: 71 mg/dL (ref 65–99)
Potassium: 4.7 mmol/L (ref 3.5–5.3)
Sodium: 142 mmol/L (ref 135–146)
Total Bilirubin: 0.3 mg/dL (ref 0.2–1.2)
Total Protein: 6.5 g/dL (ref 6.1–8.1)
eGFR: 83 mL/min/{1.73_m2} (ref >=60)

## 2022-04-06 LAB — CHLAMYDIA TRACHOMATIS,TMA(ALTERNATE TARGET),UROGENTIAL: Chlamydia Trachomatis,TMA(ALT Target),Urogenital: DETECTED — AB

## 2022-04-06 LAB — RPR: RPR Ser Ql: NONREACTIVE

## 2022-04-06 LAB — HIV ANTIBODY (ROUTINE TESTING W REFLEX): HIV 1&2 Ab, 4th Generation: NONREACTIVE

## 2022-04-06 LAB — TRICHOMONAS VAGINALIS, PROBE AMP: Trichomonas vaginalis RNA: NOT DETECTED

## 2022-04-11 ENCOUNTER — Ambulatory Visit (INDEPENDENT_AMBULATORY_CARE_PROVIDER_SITE_OTHER): Payer: 59 | Admitting: Family Medicine

## 2022-04-11 VITALS — BP 115/68 | HR 54 | Ht 75.0 in | Wt 215.0 lb

## 2022-04-11 DIAGNOSIS — Z111 Encounter for screening for respiratory tuberculosis: Secondary | ICD-10-CM | POA: Diagnosis not present

## 2022-04-11 NOTE — Progress Notes (Signed)
   Subjective:    Patient ID: Isaac Keller, male    DOB: [birth year], 26 y.o.   MRN: [id number]  HPI Patient is here for PPD placement.    Review of Systems     Objective:   Physical Exam        Assessment & Plan:

## 2022-04-11 NOTE — Progress Notes (Signed)
Medical screening examination/treatment was performed by qualified clinical staff member and as supervising physician I was immediately available for consultation/collaboration. I have reviewed documentation and agree with assessment and plan.  Mannix Kroeker, DO  

## 2022-04-13 ENCOUNTER — Ambulatory Visit (INDEPENDENT_AMBULATORY_CARE_PROVIDER_SITE_OTHER): Payer: 59 | Admitting: Family Medicine

## 2022-04-13 VITALS — BP 116/58 | HR 60

## 2022-04-13 DIAGNOSIS — Z111 Encounter for screening for respiratory tuberculosis: Secondary | ICD-10-CM | POA: Diagnosis not present

## 2022-04-13 LAB — TB SKIN TEST
Induration: 0 mm
TB Skin Test: NEGATIVE

## 2022-04-13 NOTE — Progress Notes (Signed)
Medical screening examination/treatment was performed by qualified clinical staff member and as supervising physician I was immediately available for consultation/collaboration. I have reviewed documentation and agree with assessment and plan.  Freddi Forster, DO  

## 2022-04-13 NOTE — Progress Notes (Signed)
   Established Patient Office Visit  Subjective   Patient ID: Isaac Keller, male    DOB: 03/09/1996  Age: 26 y.o. MRN: [id number]  Chief Complaint  Patient presents with   PPD Reading    HPI  Isaac Keller is here for PPD read.   ROS    Objective:     BP (!) 116/58   Pulse 60   SpO2 100%    Physical Exam   No results found for any visits on 04/13/22.    The ASCVD Risk score (Arnett DK, et al., 2019) failed to calculate for the following reasons:   The 2019 ASCVD risk score is only valid for ages 61 to 73    Assessment & Plan:  PPD - negative 73mm  Problem List Items Addressed This Visit   None Visit Diagnoses     Screening-pulmonary TB    -  Primary       No follow-ups on file.    Esmond Harps, CMA

## 2022-07-26 ENCOUNTER — Telehealth: Payer: Self-pay

## 2022-07-26 NOTE — Telephone Encounter (Signed)
Patient dropped off a physical examination form to be filled out. I told him that it could take up to 3 to 5 business days and there may be a fee. He wants to be alerted in [place] and a call when the form is completed. I've placed the paperwork in the basket. Tvt.

## 2022-07-28 NOTE — Telephone Encounter (Signed)
Form completed and placed in CMA's box.

## 2022-07-29 NOTE — Telephone Encounter (Signed)
Patient informed that form is ready for pick up - no charge per provider - and placed in accordian folder at front desk .

## 2022-08-25 ENCOUNTER — Ambulatory Visit: Payer: 59 | Admitting: Family Medicine

## 2022-08-25 ENCOUNTER — Encounter: Payer: Self-pay | Admitting: Family Medicine

## 2022-08-25 VITALS — BP 111/68 | HR 65 | Ht 75.0 in | Wt 218.0 lb

## 2022-08-25 DIAGNOSIS — R69 Illness, unspecified: Secondary | ICD-10-CM | POA: Diagnosis not present

## 2022-08-25 DIAGNOSIS — Z113 Encounter for screening for infections with a predominantly sexual mode of transmission: Secondary | ICD-10-CM | POA: Diagnosis not present

## 2022-08-25 NOTE — Assessment & Plan Note (Signed)
Orders Placed This Encounter  Procedures   Trichomonas vaginalis, RNA   Chlamydia/Neisseria Gonorrhoeae RNA,TMA,Urogenital   HIV antibody (with reflex)   RPR

## 2022-08-25 NOTE — Progress Notes (Signed)
Ethen Bannan - 26 y.o. male MRN [id number]  Date of birth: 10-09-1996  Subjective Chief Complaint  Patient presents with   Exposure to STD    HPI Efraim Vanallen is a 26 y.o. male here today to discuss STI screening.  He denies symptoms at this time.  He has not had any symptoms including dysuria, frequency, rash, lymph node enlargement, testicular pain or swelling, fever or chills.   ROS:  A comprehensive ROS was completed and negative except as noted per HPI  No Known Allergies  History reviewed. No pertinent past medical history.  Past Surgical History:  Procedure Laterality Date   MENISCUS REPAIR      Social History   Socioeconomic History   Marital status: Single    Spouse name: Not on file   Number of children: Not on file   Years of education: Not on file   Highest education level: Not on file  Occupational History   Not on file  Tobacco Use   Smoking status: Never   Smokeless tobacco: Never  Vaping Use   Vaping Use: Never used  Substance and Sexual Activity   Alcohol use: Yes    Alcohol/week: 1.0 - 2.0 standard drink of alcohol    Types: 1 - 2 Standard drinks or equivalent per week   Drug use: Never   Sexual activity: Yes    Partners: Female    Birth control/protection: Condom  Other Topics Concern   Not on file  Social History Narrative   Not on file   Social Determinants of Health   Financial Resource Strain: Not on file  Food Insecurity: Not on file  Transportation Needs: Not on file  Physical Activity: Not on file  Stress: Not on file  Social Connections: Not on file    History reviewed. No pertinent family history.  Health Maintenance  Topic Date Due   COVID-19 Vaccine (1) 09/10/2022 (Originally 08/08/1996)   INFLUENZA VACCINE  01/08/2023 (Originally 05/10/2022)   TETANUS/TDAP  06/23/2031   HPV VACCINES  Completed   Hepatitis C Screening  Completed   HIV Screening  Completed      ----------------------------------------------------------------------------------------------------------------------------------------------------------------------------------------------------------------- Physical Exam BP 111/68 (BP Location: Left Arm, Patient Position: Sitting, Cuff Size: Normal)   Pulse 65   Ht 6\' 3"  (1.905 m)   Wt 218 lb (98.9 kg)   SpO2 98%   BMI 27.25 kg/m   Physical Exam Constitutional:      Appearance: Normal appearance.  HENT:     Head: Normocephalic and atraumatic.  Eyes:     General: No scleral icterus. Cardiovascular:     Rate and Rhythm: Normal rate and regular rhythm.  Musculoskeletal:     Cervical back: Neck supple.  Neurological:     Mental Status: He is alert.  Psychiatric:        Mood and Affect: Mood normal.        Behavior: Behavior normal.     ------------------------------------------------------------------------------------------------------------------------------------------------------------------------------------------------------------------- Assessment and Plan  Routine screening for STI (sexually transmitted infection) Orders Placed This Encounter  Procedures   Trichomonas vaginalis, RNA   Chlamydia/Neisseria Gonorrhoeae RNA,TMA,Urogenital   HIV antibody (with reflex)   RPR     No orders of the defined types were placed in this encounter.   No follow-ups on file.    This visit occurred during the SARS-CoV-2 public health emergency.  Safety protocols were in place, including screening questions prior to the visit, additional usage of staff PPE, and extensive cleaning of exam room while observing appropriate  contact time as indicated for disinfecting solutions.

## 2022-08-30 LAB — RPR: RPR Ser Ql: NONREACTIVE

## 2022-08-30 LAB — NEISSERIA GONORRHOEAE, TMA (ALTERNATE TARGET), UROGENITAL: NEISSERIA GONORRHOEAE,TMA (ALT TARGET),UROGENITAL: DETECTED — AB

## 2022-08-30 LAB — CHLAMYDIA/NEISSERIA GONORRHOEAE RNA,TMA,UROGENTIAL
C. trachomatis RNA, TMA: NOT DETECTED
N. gonorrhoeae RNA, TMA: DETECTED — AB

## 2022-08-30 LAB — HIV ANTIBODY (ROUTINE TESTING W REFLEX): HIV 1&2 Ab, 4th Generation: NONREACTIVE

## 2022-08-30 LAB — TRICHOMONAS VAGINALIS, PROBE AMP: Trichomonas vaginalis RNA: NOT DETECTED

## 2022-09-05 ENCOUNTER — Telehealth: Payer: Self-pay

## 2022-09-05 NOTE — Telephone Encounter (Signed)
LVM requesting patient callback for information.  Pt to be advised of Lab results and treatment. Pt should contact sexual partners of the past 60 days and advise of Dx.   Pt needs to schedule Nurse Visit for 500mg  Rocephin.

## 2022-09-06 ENCOUNTER — Ambulatory Visit (INDEPENDENT_AMBULATORY_CARE_PROVIDER_SITE_OTHER): Payer: 59 | Admitting: Family Medicine

## 2022-09-06 VITALS — BP 119/65 | HR 62 | Resp 20 | Ht 75.0 in | Wt 224.7 lb

## 2022-09-06 DIAGNOSIS — A549 Gonococcal infection, unspecified: Secondary | ICD-10-CM | POA: Diagnosis not present

## 2022-09-06 MED ORDER — CEFTRIAXONE SODIUM 500 MG IJ SOLR
500.0000 mg | Freq: Once | INTRAMUSCULAR | Status: AC
  Administered 2022-09-06: 500 mg via INTRAMUSCULAR

## 2022-09-06 NOTE — Progress Notes (Signed)
Patient here today for rocephin 500 mg injection.  Location: [facility]

## 2022-09-06 NOTE — Progress Notes (Signed)
Patient with positive GC testing.  Rocephin 500mg  given today. Instructed to notify partners from the past 60 days.   Medical screening examination/treatment was performed by qualified clinical staff member and as supervising physician I was immediately available for consultation/collaboration. I have reviewed documentation and agree with assessment and plan.  , DO

## 2022-11-29 ENCOUNTER — Telehealth: Payer: Self-pay

## 2022-11-29 NOTE — Telephone Encounter (Signed)
Pt dropped paperwork off for Dr. Zigmund Daniel to be filled out for his health examination, forms are placed in basket. Tvt

## 2022-12-02 ENCOUNTER — Telehealth: Payer: Self-pay

## 2022-12-02 NOTE — Telephone Encounter (Signed)
Pt's health examination documentation completed by Dr. Zigmund Daniel.   Needs TB skin test. Scheduled pt for Monday,  @ 12/05/22  See CMA for documentation.

## 2022-12-02 NOTE — Telephone Encounter (Signed)
Pt has been contacted. Scheduled PPD testing.

## 2022-12-02 NOTE — Telephone Encounter (Signed)
Form completed and placed in Isaac Keller's box

## 2022-12-05 ENCOUNTER — Ambulatory Visit (INDEPENDENT_AMBULATORY_CARE_PROVIDER_SITE_OTHER): Payer: 59 | Admitting: Family Medicine

## 2022-12-05 VITALS — BP 105/58 | HR 62

## 2022-12-05 DIAGNOSIS — Z111 Encounter for screening for respiratory tuberculosis: Secondary | ICD-10-CM

## 2022-12-05 NOTE — Progress Notes (Signed)
   Established Patient Office Visit  Subjective   Patient ID: Isaac Keller, male    DOB: 15-Jun-1996  Age: 27 y.o. MRN: [id number]  Chief Complaint  Patient presents with   PPD placement- nurse visit    HPI  Patient in office for PPD placement- nurse visit-patient has had previous  PPD placed July 2023.  ROS    Objective:     BP (!) 105/58   Pulse 62   SpO2 100%    Physical Exam   No results found for any visits on 12/05/22.    The ASCVD Risk score (Arnett DK, et al., 2019) failed to calculate for the following reasons:   The 2019 ASCVD risk score is only valid for ages 29 to 81    Assessment & Plan:  PPD placement- patient tolerated injection well without complications.  Instructed to return on Wednesday 12/07/22 for PPD read as nurse visit. Patient has a physical form to be completed on this day given to Shaune Pascal to keep at her desk until patient return.  Problem List Items Addressed This Visit   None   No follow-ups on file.    Rae Lips, LPN

## 2022-12-05 NOTE — Progress Notes (Signed)
Medical screening examination/treatment was performed by qualified clinical staff member and as supervising physician I was immediately available for consultation/collaboration. I have reviewed documentation and agree with assessment and plan.  Justice Milliron, DO  

## 2022-12-07 ENCOUNTER — Ambulatory Visit: Payer: 59

## 2022-12-08 ENCOUNTER — Ambulatory Visit (INDEPENDENT_AMBULATORY_CARE_PROVIDER_SITE_OTHER): Payer: 59

## 2022-12-08 ENCOUNTER — Ambulatory Visit (INDEPENDENT_AMBULATORY_CARE_PROVIDER_SITE_OTHER): Payer: 59 | Admitting: Family Medicine

## 2022-12-08 VITALS — BP 108/52 | HR 69 | Ht 75.0 in

## 2022-12-08 DIAGNOSIS — R7611 Nonspecific reaction to tuberculin skin test without active tuberculosis: Secondary | ICD-10-CM

## 2022-12-08 LAB — TB SKIN TEST: TB Skin Test: POSITIVE

## 2022-12-08 NOTE — Progress Notes (Signed)
   Established Patient Office Visit  Subjective   Patient ID: Isaac Keller, male    DOB: 1996-01-20  Age: 27 y.o. MRN: [id number]  Chief Complaint  Patient presents with   PPD Reading    HPI  PPD read - nurse visit - PPD placed  12/05/22.  ROS    Objective:     BP (!) 108/52   Pulse 69   Ht 6' 3"$  (1.905 m)   SpO2 100%   BMI 28.09 kg/m    Physical Exam   No results found for any visits on 12/08/22.    The ASCVD Risk score (Arnett DK, et al., 2019) failed to calculate for the following reasons:   The 2019 ASCVD risk score is only valid for ages 90 to 75    Assessment & Plan:   PPD read -  positive result 12 mm induration slight redness at site. Site reviewed by cody Matthews,DO - patient sent for chest x-ray.  Problem List Items Addressed This Visit   None Visit Diagnoses     Positive PPD    -  Primary   Relevant Orders   DG Chest 2 View       No follow-ups on file.    Rae Lips, LPN

## 2022-12-08 NOTE — Progress Notes (Signed)
Medical screening examination/treatment was performed by qualified clinical staff member and as supervising physician I was immediately available for consultation/collaboration. I have reviewed documentation and agree with assessment and plan.  Borderline TST.   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  Sending for CXR.    Luetta Nutting, DO

## 2022-12-12 ENCOUNTER — Encounter: Payer: Self-pay | Admitting: Family Medicine

## 2024-02-29 ENCOUNTER — Ambulatory Visit (INDEPENDENT_AMBULATORY_CARE_PROVIDER_SITE_OTHER): Admitting: Family Medicine

## 2024-02-29 VITALS — BP 111/62 | HR 55 | Ht 75.0 in | Wt 237.0 lb

## 2024-02-29 DIAGNOSIS — Z113 Encounter for screening for infections with a predominantly sexual mode of transmission: Secondary | ICD-10-CM | POA: Diagnosis not present

## 2024-02-29 DIAGNOSIS — R369 Urethral discharge, unspecified: Secondary | ICD-10-CM | POA: Diagnosis not present

## 2024-02-29 NOTE — Progress Notes (Signed)
 [place] - 28 y.o. male MRN [id number]  Date of birth: January 25, 1996  Subjective Chief Complaint  Patient presents with   Exposure to STD    HPI Isaac Keller is a 28 y.o. male here today with plan of penile irritation and discharge.  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  This has healed but he also noted some clear penile discharge with some mild dysuria.  Does not think he is an exposed to STI but has not been checked in a while.  Denies fever, chills, lymph node enlargement or testicular pain.  ROS:  A comprehensive ROS was completed and negative except as noted per HPI  No Known Allergies  No past medical history on file.  Past Surgical History:  Procedure Laterality Date   MENISCUS REPAIR      Social History   Socioeconomic History   Marital status: Single    Spouse name: Not on file   Number of children: Not on file   Years of education: Not on file   Highest education level: Bachelor's degree (e.g., BA, AB, BS)  Occupational History   Not on file  Tobacco Use   Smoking status: Never   Smokeless tobacco: Never  Vaping Use   Vaping status: Never Used  Substance and Sexual Activity   Alcohol use: Yes    Alcohol/week: 1.0 - 2.0 standard drink of alcohol    Types: 1 - 2 Standard drinks or equivalent per week   Drug use: Never   Sexual activity: Yes    Partners: Female    Birth control/protection: Condom  Other Topics Concern   Not on file  Social History Narrative   Not on file   Social Drivers of Health   Financial Resource Strain: Medium Risk (02/29/2024)   Overall Financial Resource Strain (CARDIA)    Difficulty of Paying Living Expenses: Somewhat hard  Food Insecurity: No Food Insecurity (02/29/2024)   Hunger Vital Sign    Worried About Running Out of Food in the Last Year: Never true    Ran Out of Food in the Last Year: Never true  Transportation Needs: No  Transportation Needs (02/29/2024)   PRAPARE - Administrator, Civil Service (Medical): No    Lack of Transportation (Non-Medical): No  Physical Activity: Insufficiently Active (02/29/2024)   Exercise Vital Sign    Days of Exercise per Week: 2 days    Minutes of Exercise per Session: 50 min  Stress: No Stress Concern Present (02/29/2024)   [employer] of Occupational Health - Occupational Stress Questionnaire    Feeling of Stress : Only a little  Social Connections: Moderately Integrated (02/29/2024)   Social Connection and Isolation Panel [NHANES]    Frequency of Communication with Friends and Family: Three times a week    Frequency of Social Gatherings with Friends and Family: Once a week    Attends Religious Services: 1 to 4 times per year    Active Member of [employer] or Organizations: Yes    Attends Banker Meetings: 1 to 4 times per year    Marital Status: Never married    No family history on file.  Health Maintenance  Topic Date Due   COVID-19 Vaccine (1 - 2024-25 season) Never done   INFLUENZA VACCINE  05/10/2024   DTaP/Tdap/Td (9 - Td or Tdap) 06/23/2031   Pneumococcal Vaccine 66-39 Years old  Completed  HPV VACCINES  Completed   Hepatitis C Screening  Completed   HIV Screening  Completed   Meningococcal B Vaccine  Aged Out     ----------------------------------------------------------------------------------------------------------------------------------------------------------------------------------------------------------------- Physical Exam BP 111/62 (BP Location: Right Arm, Patient Position: Sitting, Cuff Size: Large)   Pulse (!) 55   Ht 6\' 3"  (1.905 m)   Wt 237 lb (107.5 kg)   SpO2 98%   BMI 29.62 kg/m   Physical Exam Constitutional:      Appearance: Normal appearance.  HENT:     Head: Normocephalic and atraumatic.  Cardiovascular:     Rate and Rhythm: Normal rate and regular rhythm.  Pulmonary:     Effort: Pulmonary  effort is normal.     Breath sounds: Normal breath sounds.  Genitourinary:    Comments: Small healed ulceration along the right side of the glans.  No testicular pain or swelling. Neurological:     General: No focal deficit present.     Mental Status: He is alert.  Psychiatric:        Mood and Affect: Mood normal.        Behavior: Behavior normal.     ------------------------------------------------------------------------------------------------------------------------------------------------------------------------------------------------------------------- Assessment and Plan  Penile discharge Small healing ulceration to the glans of the penis.  He also noted some clear discharge. Orders Placed This Encounter  Procedures   Chlamydia/Gonococcus/Trichomonas, NAA   HIV antibody (with reflex)   RPR     No orders of the defined types were placed in this encounter.   No follow-ups on file.

## 2024-02-29 NOTE — Assessment & Plan Note (Signed)
 Small healing ulceration to the glans of the penis.  He also noted some clear discharge. Orders Placed This Encounter  Procedures   Chlamydia/Gonococcus/Trichomonas, NAA   HIV antibody (with reflex)   RPR

## 2024-03-01 LAB — HIV ANTIBODY (ROUTINE TESTING W REFLEX): HIV Screen 4th Generation wRfx: NONREACTIVE

## 2024-03-01 LAB — RPR: RPR Ser Ql: NONREACTIVE

## 2024-03-03 LAB — CHLAMYDIA/GONOCOCCUS/TRICHOMONAS, NAA
Chlamydia by NAA: NEGATIVE
Gonococcus by NAA: NEGATIVE
Trich vag by NAA: NEGATIVE

## 2024-03-08 ENCOUNTER — Ambulatory Visit: Payer: Self-pay | Admitting: Family Medicine

## 2024-07-09 ENCOUNTER — Ambulatory Visit (INDEPENDENT_AMBULATORY_CARE_PROVIDER_SITE_OTHER): Admitting: Family Medicine

## 2024-07-09 VITALS — BP 107/68 | HR 71 | Ht 75.0 in | Wt 232.0 lb

## 2024-07-09 DIAGNOSIS — Z13220 Encounter for screening for lipoid disorders: Secondary | ICD-10-CM | POA: Diagnosis not present

## 2024-07-09 DIAGNOSIS — Z111 Encounter for screening for respiratory tuberculosis: Secondary | ICD-10-CM

## 2024-07-09 DIAGNOSIS — Z23 Encounter for immunization: Secondary | ICD-10-CM | POA: Diagnosis not present

## 2024-07-09 DIAGNOSIS — Z0000 Encounter for general adult medical examination without abnormal findings: Secondary | ICD-10-CM | POA: Diagnosis not present

## 2024-07-09 DIAGNOSIS — Z113 Encounter for screening for infections with a predominantly sexual mode of transmission: Secondary | ICD-10-CM

## 2024-07-09 NOTE — Patient Instructions (Signed)
 Preventive Care 11-26 Years Old, Male Preventive care refers to lifestyle choices and visits with your health care provider that can promote health and wellness. Preventive care visits are also called wellness exams. What can I expect for my preventive care visit? Counseling During your preventive care visit, your health care provider may ask about your: Medical history, including: Past medical problems. Family medical history. Current health, including: Emotional well-being. Home life and relationship well-being. Sexual activity. Lifestyle, including: Alcohol, nicotine or tobacco, and drug use. Access to firearms. Diet, exercise, and sleep habits. Safety issues such as seatbelt and bike helmet use. Sunscreen use. Work and work Astronomer. Physical exam Your health care provider may check your: Height and weight. These may be used to calculate your BMI (body mass index). BMI is a measurement that tells if you are at a healthy weight. Waist circumference. This measures the distance around your waistline. This measurement also tells if you are at a healthy weight and may help predict your risk of certain diseases, such as type 2 diabetes and high blood pressure. Heart rate and blood pressure. Body temperature. Skin for abnormal spots. What immunizations do I need?  Vaccines are usually given at various ages, according to a schedule. Your health care provider will recommend vaccines for you based on your age, medical history, and lifestyle or other factors, such as travel or where you work. What tests do I need? Screening Your health care provider may recommend screening tests for certain conditions. This may include: Lipid and cholesterol levels. Diabetes screening. This is done by checking your blood sugar (glucose) after you have not eaten for a while (fasting). Hepatitis B test. Hepatitis C test. HIV (human immunodeficiency virus) test. STI (sexually transmitted infection)  testing, if you are at risk. Talk with your health care provider about your test results, treatment options, and if necessary, the need for more tests. Follow these instructions at home: Eating and drinking  Eat a healthy diet that includes fresh fruits and vegetables, whole grains, lean protein, and low-fat dairy products. Drink enough fluid to keep your urine pale yellow. Take vitamin and mineral supplements as recommended by your health care provider. Do not drink alcohol if your health care provider tells you not to drink. If you drink alcohol: Limit how much you have to 0-2 drinks a day. Know how much alcohol is in your drink. In the [place] one drink equals one 12 oz bottle of beer (355 mL), one 5 oz glass of wine (148 mL), or one 1 oz glass of hard liquor (44 mL). Lifestyle Brush your teeth every morning and night with fluoride toothpaste. Floss one time each day. Exercise for at least 30 minutes 5 or more days each week. Do not use any products that contain nicotine or tobacco. These products include cigarettes, chewing tobacco, and vaping devices, such as e-cigarettes. If you need help quitting, ask your health care provider. Do not use drugs. If you are sexually active, practice safe sex. Use a condom or other form of protection to prevent STIs. Find healthy ways to manage stress, such as: Meditation, yoga, or listening to music. Journaling. Talking to a trusted person. Spending time with friends and family. Minimize exposure to UV radiation to reduce your risk of skin cancer. Safety Always wear your seat belt while driving or riding in a vehicle. Do not drive: If you have been drinking alcohol. Do not ride with someone who has been drinking. If you have been using any mind-altering substances  or drugs. While texting. When you are tired or distracted. Wear a helmet and other protective equipment during sports activities. If you have firearms in your house, make sure you  follow all gun safety procedures. Seek help if you have been physically or sexually abused. What's next? Go to your health care provider once a year for an annual wellness visit. Ask your health care provider how often you should have your eyes and teeth checked. Stay up to date on all vaccines. This information is not intended to replace advice given to you by your health care provider. Make sure you discuss any questions you have with your health care provider. Document Revised: 03/24/2021 Document Reviewed: 03/24/2021 Elsevier Patient Education  2024 [employer].

## 2024-07-09 NOTE — Assessment & Plan Note (Addendum)
 Well adult Orders Placed This Encounter  Procedures   Chlamydia/Gonococcus/Trichomonas, NAA   Mycoplasma / Ureaplasma Culture   Flu vaccine trivalent PF, 6mos and older(Flulaval,Afluria,Fluarix,Fluzone)   QuantiFERON-TB Gold Plus   HIV antibody (with reflex)   RPR   CMP14+EGFR   CBC with Differential   Lipid Panel With LDL/HDL Ratio  Screenings: Per lab orders Immunizations: Up-to-date Anticipatory guidance/risk factor reduction: Recommendations per AVS.

## 2024-07-09 NOTE — Progress Notes (Signed)
 Isaac Keller - 28 y.o. male MRN [id number]  Date of birth: 26-Oct-1995  Subjective Chief Complaint  Patient presents with   Annual Exam    HPI Isaac Keller is a 28 y.o. male here today for annual exam.   He reports that he is doing pretty well.   He is starting PT assistant clinicals.  He needs updated TB screening in January.   He is moderately active.  He does referee basketball.  He feels that he is doing well with diet.   He is a non-smoker.  He denies EtOH use.   Review of Systems  Constitutional:  Negative for chills, fever, malaise/fatigue and weight loss.  HENT:  Negative for congestion, ear pain and sore throat.   Eyes:  Negative for blurred vision, double vision and pain.  Respiratory:  Negative for cough and shortness of breath.   Cardiovascular:  Negative for chest pain and palpitations.  Gastrointestinal:  Negative for abdominal pain, blood in stool, constipation, heartburn and nausea.  Genitourinary:  Negative for dysuria and urgency.  Musculoskeletal:  Negative for joint pain and myalgias.  Neurological:  Negative for dizziness and headaches.  Endo/Heme/Allergies:  Does not bruise/bleed easily.  Psychiatric/Behavioral:  Negative for depression. The patient is not nervous/anxious and does not have insomnia.     No Known Allergies  No past medical history on file.  Past Surgical History:  Procedure Laterality Date   MENISCUS REPAIR      Social History   Socioeconomic History   Marital status: Single    Spouse name: Not on file   Number of children: Not on file   Years of education: Not on file   Highest education level: Bachelor's degree (e.g., BA, AB, BS)  Occupational History   Not on file  Tobacco Use   Smoking status: Never   Smokeless tobacco: Never  Vaping Use   Vaping status: Never Used  Substance and Sexual Activity   Alcohol use: Yes    Alcohol/week: 1.0 - 2.0 standard drink of alcohol    Types: 1 - 2 Standard drinks or equivalent per  week   Drug use: Never   Sexual activity: Yes    Partners: Female    Birth control/protection: Condom  Other Topics Concern   Not on file  Social History Narrative   Not on file   Social Drivers of Health   Financial Resource Strain: Patient Declined (07/09/2024)   Overall Financial Resource Strain (CARDIA)    Difficulty of Paying Living Expenses: Patient declined  Food Insecurity: No Food Insecurity (07/09/2024)   Hunger Vital Sign    Worried About Running Out of Food in the Last Year: Never true    Ran Out of Food in the Last Year: Never true  Transportation Needs: No Transportation Needs (07/09/2024)   PRAPARE - Administrator, Civil Service (Medical): No    Lack of Transportation (Non-Medical): No  Physical Activity: Sufficiently Active (07/09/2024)   Exercise Vital Sign    Days of Exercise per Week: 3 days    Minutes of Exercise per Session: 50 min  Stress: No Stress Concern Present (07/09/2024)   [employer] of Occupational Health - Occupational Stress Questionnaire    Feeling of Stress: Only a little  Social Connections: Socially Isolated (07/09/2024)   Social Connection and Isolation Panel    Frequency of Communication with Friends and Family: Once a week    Frequency of Social Gatherings with Friends and Family: Once a week  Attends Religious Services: 1 to 4 times per year    Active Member of Clubs or Organizations: No    Attends Banker Meetings: Not on file    Marital Status: Never married    No family history on file.  Health Maintenance  Topic Date Due   Influenza Vaccine  05/10/2024   COVID-19 Vaccine (1 - 2024-25 season) Never done   DTaP/Tdap/Td (9 - Td or Tdap) 06/23/2031   Pneumococcal Vaccine  Completed   Hepatitis B Vaccines 19-59 Average Risk  Completed   HPV VACCINES  Completed   Hepatitis C Screening  Completed   HIV Screening  Completed   Meningococcal B Vaccine  Aged Out      ----------------------------------------------------------------------------------------------------------------------------------------------------------------------------------------------------------------- Physical Exam BP 107/68 (BP Location: Right Arm, Patient Position: Sitting, Cuff Size: Large)   Pulse 71   Ht 6' 3 (1.905 m)   Wt 232 lb (105.2 kg)   SpO2 97%   BMI 29.00 kg/m   Physical Exam Constitutional:      General: He is not in acute distress. HENT:     Head: Normocephalic and atraumatic.     Right Ear: Tympanic membrane and external ear normal.     Left Ear: Tympanic membrane and external ear normal.  Eyes:     General: No scleral icterus. Neck:     Thyroid: No thyromegaly.  Cardiovascular:     Rate and Rhythm: Normal rate and regular rhythm.     Heart sounds: Normal heart sounds.  Pulmonary:     Effort: Pulmonary effort is normal.     Breath sounds: Normal breath sounds.  Abdominal:     General: Bowel sounds are normal. There is no distension.     Palpations: Abdomen is soft.     Tenderness: There is no abdominal tenderness. There is no guarding.  Musculoskeletal:     Cervical back: Normal range of motion.  Lymphadenopathy:     Cervical: No cervical adenopathy.  Skin:    General: Skin is warm and dry.     Findings: No rash.  Neurological:     Mental Status: He is alert and oriented to person, place, and time.     Cranial Nerves: No cranial nerve deficit.     Motor: No abnormal muscle tone.  Psychiatric:        Mood and Affect: Mood normal.        Behavior: Behavior normal.     ------------------------------------------------------------------------------------------------------------------------------------------------------------------------------------------------------------------- Assessment and Plan  Well adult exam Well adult Orders Placed This Encounter  Procedures   Chlamydia/Gonococcus/Trichomonas, NAA   Mycoplasma / Ureaplasma  Culture   Flu vaccine trivalent PF, 6mos and older(Flulaval,Afluria,Fluarix,Fluzone)   QuantiFERON-TB Gold Plus   HIV antibody (with reflex)   RPR   CMP14+EGFR   CBC with Differential   Lipid Panel With LDL/HDL Ratio  Screenings: Per lab orders Immunizations: Up-to-date Anticipatory guidance/risk factor reduction: Recommendations per AVS.     No orders of the defined types were placed in this encounter.   No follow-ups on file.

## 2024-07-11 LAB — CHLAMYDIA/GONOCOCCUS/TRICHOMONAS, NAA
Chlamydia by NAA: NEGATIVE
Gonococcus by NAA: NEGATIVE
Trich vag by NAA: NEGATIVE

## 2024-07-12 ENCOUNTER — Ambulatory Visit: Payer: Self-pay | Admitting: Family Medicine

## 2024-07-16 LAB — CMP14+EGFR
ALT: 25 IU/L (ref 0–44)
AST: 25 IU/L (ref 0–40)
Albumin: 4 g/dL — ABNORMAL LOW (ref 4.3–5.2)
Alkaline Phosphatase: 40 IU/L — ABNORMAL LOW (ref 47–123)
BUN/Creatinine Ratio: 7 — ABNORMAL LOW (ref 9–20)
BUN: 9 mg/dL (ref 6–20)
Bilirubin Total: 0.4 mg/dL (ref 0.0–1.2)
CO2: 25 mmol/L (ref 20–29)
Calcium: 9 mg/dL (ref 8.7–10.2)
Chloride: 104 mmol/L (ref 96–106)
Creatinine, Ser: 1.21 mg/dL (ref 0.76–1.27)
Globulin, Total: 1.6 g/dL (ref 1.5–4.5)
Glucose: 118 mg/dL — ABNORMAL HIGH (ref 70–99)
Potassium: 3.8 mmol/L (ref 3.5–5.2)
Sodium: 141 mmol/L (ref 134–144)
Total Protein: 5.6 g/dL — ABNORMAL LOW (ref 6.0–8.5)
eGFR: 85 mL/min/1.73 (ref >59)

## 2024-07-16 LAB — LIPID PANEL WITH LDL/HDL RATIO
Cholesterol, Total: 109 mg/dL (ref 100–199)
HDL: 36 mg/dL — ABNORMAL LOW (ref >39)
LDL Chol Calc (NIH): 62 mg/dL (ref 0–99)
LDL/HDL Ratio: 1.7 ratio (ref 0.0–3.6)
Triglycerides: 47 mg/dL (ref 0–149)
VLDL Cholesterol Cal: 11 mg/dL (ref 5–40)

## 2024-07-16 LAB — CBC WITH DIFFERENTIAL/PLATELET
Basophils Absolute: 0.1 x10E3/uL (ref 0.0–0.2)
Basos: 1 %
EOS (ABSOLUTE): 0.3 x10E3/uL (ref 0.0–0.4)
Eos: 4 %
Hematocrit: 41.8 % (ref 37.5–51.0)
Hemoglobin: 13.3 g/dL (ref 13.0–17.7)
Immature Grans (Abs): 0 x10E3/uL (ref 0.0–0.1)
Immature Granulocytes: 0 %
Lymphocytes Absolute: 2.7 x10E3/uL (ref 0.7–3.1)
Lymphs: 38 %
MCH: 27.2 pg (ref 26.6–33.0)
MCHC: 31.8 g/dL (ref 31.5–35.7)
MCV: 86 fL (ref 79–97)
Monocytes Absolute: 0.5 x10E3/uL (ref 0.1–0.9)
Monocytes: 7 %
Neutrophils Absolute: 3.5 x10E3/uL (ref 1.4–7.0)
Neutrophils: 50 %
Platelets: 229 x10E3/uL (ref 150–450)
RBC: 4.89 x10E6/uL (ref 4.14–5.80)
RDW: 13.4 % (ref 11.6–15.4)
WBC: 7 x10E3/uL (ref 3.4–10.8)

## 2024-07-16 LAB — HIV ANTIBODY (ROUTINE TESTING W REFLEX): HIV Screen 4th Generation wRfx: NONREACTIVE

## 2024-07-16 LAB — MYCOPLASMA / UREAPLASMA CULTURE
Mycoplasma hominis Culture: NEGATIVE
Ureaplasma urealyticum: NEGATIVE

## 2024-07-16 LAB — RPR: RPR Ser Ql: NONREACTIVE
